# Patient Record
Sex: Male | Born: 1935 | Race: White | Hispanic: No | Smoking: Never smoker
Health system: Southern US, Community
[De-identification: ages and names within clinical notes are randomized; demographics above are authoritative.]

## PROBLEM LIST (undated history)

## (undated) DIAGNOSIS — H269 Unspecified cataract: Secondary | ICD-10-CM

## (undated) DIAGNOSIS — Z7901 Long term (current) use of anticoagulants: Secondary | ICD-10-CM

## (undated) DIAGNOSIS — I4891 Unspecified atrial fibrillation: Secondary | ICD-10-CM

## (undated) DIAGNOSIS — E785 Hyperlipidemia, unspecified: Secondary | ICD-10-CM

## (undated) DIAGNOSIS — R609 Edema, unspecified: Secondary | ICD-10-CM

## (undated) DIAGNOSIS — N400 Enlarged prostate without lower urinary tract symptoms: Secondary | ICD-10-CM

## (undated) DIAGNOSIS — I2510 Atherosclerotic heart disease of native coronary artery without angina pectoris: Secondary | ICD-10-CM

## (undated) HISTORY — PX: CHOLECYSTECTOMY: SHX55

## (undated) HISTORY — PX: HERNIA REPAIR: SHX51

## (undated) HISTORY — DX: Edema, unspecified: R60.9

## (undated) HISTORY — DX: Unspecified atrial fibrillation: I48.91

## (undated) HISTORY — PX: CARDIAC SURGERY: SHX584

## (undated) HISTORY — PX: OTHER SURGICAL HISTORY: SHX169

## (undated) HISTORY — DX: Hyperlipidemia, unspecified: E78.5

## (undated) HISTORY — DX: Long term (current) use of anticoagulants: Z79.01

## (undated) HISTORY — DX: Benign prostatic hyperplasia without lower urinary tract symptoms: N40.0

## (undated) HISTORY — DX: Atherosclerotic heart disease of native coronary artery without angina pectoris: I25.10

## (undated) HISTORY — DX: Unspecified cataract: H26.9

---

## 1996-12-19 HISTORY — PX: CORONARY ARTERY BYPASS GRAFT: SHX141

## 1999-10-11 ENCOUNTER — Inpatient Hospital Stay (HOSPITAL_COMMUNITY): Admission: EM | Admit: 1999-10-11 | Discharge: 1999-10-12 | Payer: Self-pay | Admitting: Emergency Medicine

## 1999-10-11 ENCOUNTER — Encounter: Payer: Self-pay | Admitting: *Deleted

## 1999-12-14 ENCOUNTER — Encounter: Payer: Self-pay | Admitting: General Surgery

## 1999-12-17 ENCOUNTER — Ambulatory Visit (HOSPITAL_COMMUNITY): Admission: RE | Admit: 1999-12-17 | Discharge: 1999-12-18 | Payer: Self-pay | Admitting: General Surgery

## 2000-11-01 ENCOUNTER — Inpatient Hospital Stay (HOSPITAL_COMMUNITY): Admission: AD | Admit: 2000-11-01 | Discharge: 2000-11-04 | Payer: Self-pay | Admitting: *Deleted

## 2004-10-02 ENCOUNTER — Emergency Department (HOSPITAL_COMMUNITY): Admission: EM | Admit: 2004-10-02 | Discharge: 2004-10-02 | Payer: Self-pay | Admitting: *Deleted

## 2004-10-27 ENCOUNTER — Ambulatory Visit: Payer: Self-pay | Admitting: Cardiology

## 2005-01-18 ENCOUNTER — Ambulatory Visit: Payer: Self-pay

## 2005-01-18 ENCOUNTER — Ambulatory Visit: Payer: Self-pay | Admitting: Cardiology

## 2005-09-09 ENCOUNTER — Ambulatory Visit: Payer: Self-pay | Admitting: Cardiology

## 2006-02-20 ENCOUNTER — Ambulatory Visit: Payer: Self-pay | Admitting: Cardiology

## 2006-06-16 ENCOUNTER — Ambulatory Visit: Payer: Self-pay | Admitting: Gastroenterology

## 2006-11-20 ENCOUNTER — Ambulatory Visit: Payer: Self-pay | Admitting: Cardiology

## 2007-08-22 ENCOUNTER — Ambulatory Visit: Payer: Self-pay | Admitting: Cardiology

## 2008-06-06 ENCOUNTER — Ambulatory Visit: Payer: Self-pay | Admitting: Cardiology

## 2009-03-19 DIAGNOSIS — I2510 Atherosclerotic heart disease of native coronary artery without angina pectoris: Secondary | ICD-10-CM

## 2009-03-19 DIAGNOSIS — Z87898 Personal history of other specified conditions: Secondary | ICD-10-CM

## 2009-03-19 DIAGNOSIS — E785 Hyperlipidemia, unspecified: Secondary | ICD-10-CM

## 2009-03-19 DIAGNOSIS — R609 Edema, unspecified: Secondary | ICD-10-CM

## 2009-03-19 DIAGNOSIS — I4891 Unspecified atrial fibrillation: Secondary | ICD-10-CM

## 2009-03-25 ENCOUNTER — Ambulatory Visit: Payer: Self-pay | Admitting: Cardiology

## 2009-09-04 ENCOUNTER — Encounter (INDEPENDENT_AMBULATORY_CARE_PROVIDER_SITE_OTHER): Payer: Self-pay | Admitting: *Deleted

## 2009-11-25 ENCOUNTER — Ambulatory Visit: Payer: Self-pay | Admitting: Cardiology

## 2010-08-17 ENCOUNTER — Encounter: Payer: Self-pay | Admitting: Cardiology

## 2010-12-27 ENCOUNTER — Encounter: Payer: Self-pay | Admitting: Cardiology

## 2011-01-12 ENCOUNTER — Ambulatory Visit
Admission: RE | Admit: 2011-01-12 | Discharge: 2011-01-12 | Payer: Self-pay | Source: Home / Self Care | Attending: Cardiology | Admitting: Cardiology

## 2011-01-12 ENCOUNTER — Encounter: Payer: Self-pay | Admitting: Cardiology

## 2011-01-20 NOTE — Assessment & Plan Note (Signed)
Summary: [facility]   Visit Type:  Follow-up Primary Provider:  Dr. Vernon Prey  CC:  Atrial fibrillation and CAD.  History of Present Illness: The patient returns for follow up of atrial fibrillation and CAD.  Since I last saw him he has done quite well.  He does his activities of daily living.  He does not have any chest discomfort, neck or arm discomfort.  He does not notice his palpitations and he has no presyncope or syncope.  He has had no new SOB, PND or orthopnea.  He tolerates coumandin.  Current Medications (verified): 1)  Simvastatin 20 Mg Tabs (Simvastatin) .... Take 1/2 Tablet By Mouth Daily At Bedtime 2)  Metoprolol Tartrate 50 Mg Tabs (Metoprolol Tartrate) .... Take One Tablet By Mouth Twice A Day 3)  Lisinopril 10 Mg Tabs (Lisinopril) .... Take One Tablet By Mouth At Bedtime 4)  Isosorbide Mononitrate Cr 30 Mg Xr24h-Tab (Isosorbide Mononitrate) .... Take One Tablet By Mouth Daily 5)  Warfarin Sodium 5 Mg Tabs (Warfarin Sodium) .... Use As Directed By Anticoagulation Clinic 6)  Potassium Chloride Crys Cr 20 Meq Cr-Tabs (Potassium Chloride Crys Cr) .... Take One Tablet By Mouth Daily 7)  Fish Oil   Oil (Fish Oil) .... 3 Once Daily 8)  Calcium Carbonate-Vitamin D 600-400 Mg-Unit  Tabs (Calcium Carbonate-Vitamin D) .... Once Daily 9)  Omeprazole 20 Mg Cpdr (Omeprazole) .... One By Mouth At Bedtime 10)  Docusate Sodium 100 Mg Caps (Docusate Sodium) .... As Needed  Allergies (verified): 1)  ! Penicillin  Past History:  Past Medical History: Reviewed history from 03/19/2009 and no changes required. EDEMA (ICD-782.3) BENIGN PROSTATIC HYPERTROPHY, HX OF (ICD-V13.8) COUMADIN THERAPY (ICD-V58.61) ATRIAL FIBRILLATION (ICD-427.31) HYPERLIPIDEMIA-MIXED (ICD-272.4) CAD, UNSPECIFIED SITE (ICD-414.00) (status post CABG in 1998, had a LIMA to       the LAD, saphenous vein graft to diagonal, saphenous vein graft to       an obtuse marginal 1, and saphenous vein graft to an  obtuse       marginal 2, and obtuse marginal to the PDA.  He did have       catheterization in 2001 demonstrating grafts to be patent).     Past Surgical History: CABG -- 1998 Cholecystectomy Bilateral inguinal hernia repairs  Review of Systems       As stated in the HPI and negative for all other systems.   Vital Signs:  Patient profile:   77 year old male Height:      65 inches Weight:      173 pounds BMI:     28.89 Pulse rate:   85 / minute Resp:     16 per minute BP sitting:   142 / 78  (right arm)  Vitals Entered By: Marrion Coy, CNA (January 12, 2011 11:26 AM)  Physical Exam  General:  Well developed, well nourished, in no acute distress. Head:  normocephalic and atraumatic Neck:  Neck supple, no JVD. No masses, thyromegaly or abnormal cervical nodes. Chest Wall:  well-healed sternal scar Lungs:  Clear bilaterally to auscultation and percussion. Heart:  S1 and S2 within normal limits, no S3, no S4, no clicks, no rubs, no murmurs. Abdomen:  Bowel sounds positive; abdomen soft and non-tender without masses, organomegaly, or hernias noted. No hepatosplenomegaly. Msk:  Back normal, normal gait. Muscle strength and tone normal. Extremities:  Rght greater than left lower extremity edema to mid calf with saphenous vein graft harvest scar on the right Neurologic:  Alert and oriented  x 3. Skin:  Intact without lesions or rashes. Cervical Nodes:  no significant adenopathy Psych:  Normal affect.    EKG  Procedure date:  01/12/2011  Findings:      atrial fibrillation right axis deviation, nonspecific T wave inversions, incomplete right bundle branch block  Impression & Recommendations:  Problem # 1:  ATRIAL FIBRILLATION (ICD-427.31) He is having no symptoms related to this. No change in therapy is indicated. Orders: EKG w/ Interpretation (93000)  Problem # 2:  CAD, UNSPECIFIED SITE (ICD-414.00) He is having no symptoms and is participating in risk reduction.  No  change in therapy is indicated.   Orders: EKG w/ Interpretation (93000)  Problem # 3:  HYPERLIPIDEMIA-MIXED (ICD-272.4) He has an excellent lipid proflie.  He will continue with the meds as listed.  Patient Instructions: 1)  Your physician has recommended you make the following change in your medication:  2)  Your physician wants you to follow-up in:  1 yr in [place] with Dr Antoine Poche. You will receive a reminder letter in the mail two months in advance. If you don't receive a letter, please call our office to schedule the follow-up appointment.

## 2011-05-03 NOTE — Assessment & Plan Note (Signed)
[facility]                            CARDIOLOGY OFFICE NOTE   QUADIR, MUNS                    MRN:          [id number]  DATE:08/22/2007                            DOB:          25-Jun-1915    [name] is a delightful 77 year old gentleman who continues to do  well.  He says that his legs have some weakness at times.  He is not  having pain.  He has not had syncope or presyncope.  There is no chest  pain.  There is no major shortness of breath.  He does have coronary  disease that has been stable.  His lipids are followed carefully through  Dr. Kathi Der office.   PAST MEDICAL HISTORY:  See the list below.   ALLERGIES:  PENICILLIN.   MEDICATIONS:  Omeprazole, metoprolol, lisinopril, Imdur, furosemide,  Coumadin, potassium, fish oil, calcium, and simvastatin.   REVIEW OF SYSTEMS:  As mentioned, he has had some mild gait problems,  otherwise his review of systems is negative, and as he says, he has  nothing to complain about.   PHYSICAL EXAMINATION:  Blood pressure is 140/71 with a pulse of 87, and  his weight is 174 pounds.  Patient is oriented to person, time, and place.  Affect is normal.  HEENT:  No xanthelasma.  He has normal extraocular motion.  NECK:  There are no carotid bruits.  There is no jugular venous  distention.  LUNGS:  Clear.  Respiratory effort is not labored.  CARDIAC:  An S1 with an S2.  ABDOMEN:  His are intact.  There are no masses or breath sounds.  He has  normal bowel sounds.  EXTREMITIES:  He has trace peripheral edema at his right ankle.  He has  1-2+ distal pulses.  There are no musculoskeletal deformities.   EKG shows no significant change.  He has chronic atrial fibrillation  with a controlled rate.   PROBLEMS:  1. Atrial fibrillation with a controlled rate on Coumadin.  2. Coumadin therapy.  3. Coronary disease, stable.  4. History of gynecomastia in the past related to digoxin.  5. Status post  cholecystectomy.  6. Status post bilateral inguinal hernia repairs and a laparoscopic      cholecystectomy and hiatal hernia repair.  7. Benign prostatic hypertrophy.  8. Cholesterol, treated.  9. Slight swelling in his right ankle.  We know from the past that he      has no deep venous thrombosis.  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   [name] [name] is doing well.  I can see him back in 9 months.     [name] Abed, MD, [facility]  Electronically Signed    JDK/MedQ  DD: 08/22/2007  DT: 08/22/2007  Job #: [id number]   cc:   Ernestina Penna, M.D.

## 2011-05-03 NOTE — Assessment & Plan Note (Signed)
[facility]                            CARDIOLOGY OFFICE NOTE   [name] [name], [name] [name]                    MRN:          [id number]  DATE:03/25/2009                            DOB:          [birth year]    PRIMARY CARE PHYSICIAN:  Ernestina Penna, MD   REASON FOR PRESENTATION:  Evaluate the patient with coronary artery  disease and atrial fibrillation.   HISTORY OF PRESENT ILLNESS:  The patient is a 77 year old gentleman.  He  has routinely seen Dr. Myrtis Ser.  I did see him several years ago in Dr.  Henrietta Hoover place.  He gets followup every 8 months because of his  cardiovascular problems.  These are described below.  In short, he has  had an infarct with thrombolytics in 1984.  Had bypass graft in 1998.  He has been managed medically since 2001 when he had his last cath.  Apparently gets along fairly well from this standpoint.  He does not  describe any ongoing chest pressure, neck, or arm discomfort.  He stays  active.  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  He lives by  himself.  He does have atrial fibrillation, but does not feel his heart  beating.  He has no palpitations, presyncope, or syncope.  He has had a  couple of falls.  Yesterday actually while getting off his lawn mower he  caught his foot and fell, but he did not hurt himself.  Prior to seeing  Dr. Myrtis Ser last year, he had loss of his footing.  However, these are the  only 2 episodes.  He has not had any frank loss of consciousness.  He is  careful and walks with a cane typically.  He has slowed down a little  bit because of his mild sense of feeling off balance.   PAST MEDICAL HISTORY:  1. Coronary artery disease (status post CABG in 1998, had a LIMA to      the LAD, saphenous vein graft to diagonal, saphenous vein graft to      an obtuse marginal 1, and saphenous vein graft to an obtuse      marginal 2, and obtuse marginal to the PDA.  He did have      catheterization in 2001  demonstrating grafts to be patent).  2. Persistent atrial fibrillation.  3. Benign prostatic hypertrophy.  4. L4-L5 disc disease.  5. Sigmoid diverticulosis.  6. Hiatal hernia repair.  7. Cholecystectomy.  8. Bilateral inguinal hernia repairs.  9. Transurethral resection of the prostate.  10.CABG as above.   ALLERGIES:  PENICILLIN.   CURRENT MEDICATIONS:  1. Omeprazole 20 mg daily.  2. Metoprolol 50 mg daily.  3. Lisinopril 10 mg daily.  4. Isosorbide 30 mg daily.  5. Furosemide 20 mg daily.  6. Coumadin.  7. Potassium 20 mEq daily.  8. Fish oil.  9. Calcium.  10.Simvastatin 10 mg daily.   REVIEW OF SYSTEMS:  As stated in the HPI and otherwise negative for all  other systems.   PHYSICAL EXAMINATION:  GENERAL:  The patient is pleasant in no distress.  VITAL SIGNS:  Blood pressure 140/70, heart rate 79 and irregular, weight  177 pounds, and body mass index 29.  HEENT:  Eyelids unremarkable, pupils equal, round, and reactive to  light, fundi not visualized, oral mucosa unremarkable.  NECK:  No jugular venous distention at 45 degrees, carotid upstroke  brisk and symmetric, no bruits, no thyromegaly.  LYMPHATICS:  No cervical, axillary, or inguinal adenopathy.  LUNGS:  Clear to auscultation bilaterally.  BACK:  No costovertebral angle tenderness.  CHEST:  Well-healed sternotomy scar.  HEART:  PMI not displaced or sustained, S1 and S2 within normal limits;  no S3, no murmurs.  ABDOMEN:  Flat; positive bowel sounds, normal in frequency and pitch, no  bruits, no rebound, no guarding, no midline pulsatile mass, no  hepatomegaly, no splenomegaly.  SKIN:  No rashes.  No nodules.  EXTREMITIES:  Pulses 2+ throughout, trace bilateral lower extremity  edema.  NEUROLOGIC:  Oriented to person, place, and time, cranial nerves through  XII grossly intact, motor grossly intact.   EKG, atrial fibrillation, rate 79, right axis deviation, no acute ST-T  wave changes.    ASSESSMENT/PLAN:  1. Coronary artery disease.  I reviewed all of the old records.  Given      his absence of symptoms, he is going to continue to be managed      medically.  With his age, we will continue with risk reduction      alone.  No further cardiovascular testing is suggested.  2. Atrial fibrillation.  I have had a long discussion with him about      whether he is at risk for falls.  He understands the risk of      falling while on the Coumadin and the need for being very careful      to avoid this.  However, I do not think there was a      contraindication to the Coumadin, but would wait for further      events.  If he has more of these falls in any kind of a pattern,      then he would have to come off the Coumadin.  Again we had a long      discussion about this.  3. Hypertension.  Blood pressure is at the upper limits of acceptable.      He will continue the medicines as listed.  4. Follow up.  I will see the patient again in 8 months per his      request.  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     Rollene Rotunda, MD, [facility]  Electronically Signed    JH/MedQ  DD: 03/25/2009  DT: 03/26/2009  Job #: [id number]   cc:   Ernestina Penna, M.D.

## 2011-05-03 NOTE — Assessment & Plan Note (Signed)
[name]                            CARDIOLOGY OFFICE NOTE   [name], [name] [name]                    MRN:          [id number]  DATE:06/06/2008                            DOB:          06-Jan-1915    [name] [name] is seen for followup.  He is 77 years old.  He looks great.  He has not had syncope or presyncope.  There has been no chest pain or  shortness of breath.  He did fall once after being startled by someone  knocking at his back door.  This was not a syncopal episode.  It is not  an indication to stop his Coumadin at this point.   He does have atrial fib and has been stable.   PAST MEDICAL HISTORY:   ALLERGIES:  PENICILLIN.   MEDICATIONS:  Omeprazole, metoprolol, lisinopril, Imdur, furosemide,  Coumadin, potassium, fish oil, calcium, and simvastatin.   OTHER MEDICAL PROBLEMS:  See the list below.   REVIEW OF SYSTEMS:  He really feels well.  His review of systems is  negative.   PHYSICAL EXAMINATION:  VITAL SIGNS:  Weight is 173 pounds.  Blood  pressure is 130/70 with a pulse of 67.  GENERAL:  The patient is oriented to person, time, and place.  Affect is  normal.  HEENT:  No xanthelasma.  He has normal extraocular motion.  NECK:  There are no carotid bruits.  There is no jugular venous  distention.  LUNGS:  Clear.  Respiratory effort is not labored.  CARDIAC:  S1 with an S2.  There are no clicks or significant murmurs.  Rhythm is irregularly irregular.  ABDOMEN:  Soft.  EXTREMITIES: There is no significant peripheral edema.   EKG reveals atrial fibrillation with a controlled ventricular response.  He has an old decreased anterior R-wave progression.   PROBLEMS:  1. Atrial fibrillation.  Rate controlled.  He is on Coumadin.  No      change in his therapy.  2. Coumadin therapy.  3. Coronary disease, stable.  He does not need any testing for his      coronary disease at this time.  The patient is post coronary artery      bypass  graft in the past.  Last catheterization was done in 2001.      He had a total occlusion of his native right.  He had inferior      hypokinesis.  Ejection fraction then was 58% with mild mitral      regurgitation.  4. History of gynecomastia in the past related to digoxin.  5. Status post cholecystectomy.  6. Status post bilateral inguinal hernia repair, laparoscopic      cholecystectomy, and hiatal hernia repair in the past.  7. Benign prostatic hypertrophy.  8. Elevated cholesterol, treated.  9. Mild swelling in one of his ankles historically, and this is an old      problem.   [name] [name] is stable.  It may be easier for him to be seen in the [name].  Travel is becoming difficult for him.  He has not yet decided.  I made it clear that we would be happy to arrange whatever is best for  him.     [name], MD, [facility]  Electronically Signed    JDK/MedQ  DD: 06/06/2008  DT: 06/06/2008  Job #: [id number]   cc:   Ernestina Penna, M.D.

## 2011-05-06 NOTE — Cardiovascular Report (Signed)
[place]  Patient:    [name] [name], [name] [name]                      MRN: [id number] Proc. Date: 11/01/00 Adm. Date:  [id number] Attending:  Daisey Must CC:         Monica Becton, M.D.  [name], M.D. [facility]  Cardiac Cath Lab   Cardiac Catheterization  PROCEDURES PERFORMED:  Left heart catheterization with coronary angiography, bypass graft angiography, and left ventriculography.  INDICATIONS:  Mr. [name] is an 77 year old male with history of previous coronary artery bypass grafting.  He presented to [facility] this morning with left arm pain and inferior ST segment elevation. He was transferred emergently to cardiac catheterization laboratory.  DESCRIPTION OF PROCEDURE:  A 7 French sheath was placed in the right femoral artery and 6 French sheath in the right femoral vein.  Catheters utilized included a 6 [place] JL4, JR4, LCV, IM, and angled pigtail.  Contrast was hexabrix.  There were no complications.  RESULTS:  HEMODYNAMICS:  Left ventricular pressure 156/15.  Aortic pressure 140/65. There was no aortic valve gradient.  LEFT VENTRICULOGRAM:  There is moderate hypokinesis of the inferior wall. Ejection fraction calculated at 58%.  2+ mild mitral regurgitation.  CORONARY ARTERIOGRAPHY:  (Right dominant).  Left main:  Left main has diffuse 50% stenosis.  Left anterior descending:  The LAD has a 95% stenosis in the proximal and mid vessel.  There is a normal sized first diagonal branch which has a 95% stenosis.  Both the first diagonal and the distal LAD filled via bypass grafts.  Left circumflex:  The left circumflex is 100% occluded after a small first obtuse marginal branch.  Proximal to its occlusion, it gives rise to a small ramus intermedius and a small OM-1.  The distal circumflex consists of a normal sized second marginal and large third marginal and a small fourth marginal.  These fill via  saphenous vein grafts as to be described below.  Right coronary artery:  The right coronary artery is 100% occluded proximally with thrombus and significant calcification.  It has a high anterior takeoff. The distal right coronary artery consisting of a normal sized posterior descending artery, a small posterolateral branch filled via saphenous vein graft.  Left internal mammary artery:  Left internal mammary artery to the distal LAD is patent throughout its course and fills the mid and distal LAD.  Saphenous vein graft to the first diagonal has a 20% stenosis proximally but otherwise patent and fills a normal sized first diagonal.  Saphenous vein graft to the second obtuse marginal branch is patent with diffuse 20% stenosis throughout the graft but good flow distally.  Saphenous vein graft to the third obtuse marginal branch is patent with moderate scattered areas of ectasia.  This fills the third marginal branch which is large and retrograde to the circumflex and fourth marginal branch. In the proximal portion of the third marginal branch is an 80% stenosis.  Saphenous vein graft to the posterior descending artery is patent filling a normal sized posterior descending artery.  There is an 80% stenosis in the proximal PDA before the vein graft insertion.  This graft also fills retrograde to a small posterolateral branch.  In comparison to the previous catheterization, the native right coronary artery had previously been patent to the mid vessel where it ended as an acute marginal and then was was occluded.  This vessel is now  occluded.  I have noted on previous catheterization the right coronary artery is diffusely diseased, and the proximal and mid vessel with diffuse 95% stenosis and heavy calcifications.  IMPRESSIONS: 1. Native three-vessel coronary artery disease. 2. Patent bypass grafts x 5. 3. Left ventricular systolic function at lower range of normal with mild    mitral  regurgitation. 4. Acute myocardial infarction secondary to occlusion of the native right    coronary artery which previously supplied an acute marginal branch.  PLAN:  Because of the small territory of myocardium served by the right coronary artery which is now occluded and previously seen to be diffusely diseased vessel with heavy calcification, the best approach here appears to be medical therapy with no attempts at intervention.  The patient has preserved left ventricular function and all of his major coronary arteries supplying the left ventricular are perfused by bypass grafts.  These findings were reviewed with Dr. Corinda Gubler who concurs with the treatment plan.  The patient will be treated with standard post MI care including heparin, nitroglycerin, and beta-blocker. DD:  11/01/00 TD:  11/01/00 Job: [id number]

## 2011-05-06 NOTE — Discharge Summary (Signed)
[place]. [facility]  Patient:    [name] [name], [name] [name]                      MRN: [id number] Adm. Date:  [id number] Disc. Date: [id number] Attending:  Daisey Must Dictator:   Tereso Newcomer, P.A. CC:         Monica Becton, M.D.   Referring Physician Discharge [facility]  DATE OF BIRTH:  07-07-1915  DISCHARGE DIAGNOSES:  1. Status post acute inferior wall myocardial infarction.  2. Coronary artery disease.  3. Status post coronary artery bypass grafting in July 1998, left internal     mammary artery to left anterior descending, saphenous vein graft to D1,     saphenous vein graft to obtuse marginal 1, saphenous vein graft to obtuse     marginal 2, saphenous vein graft to posterior descending artery.  4. Postoperative atrial fibrillation.  5. Inferior myocardial infarction in 1984 treated with ______ kinase and     percutaneous transluminal coronary angiography of the right coronary     artery.  6. History of paroxysmal atrial fibrillation.  7. Benign prostatic hypertrophy.  8. Hiatal hernia repair.  9. Status post laparoscopic cholecystectomy December 2000. 10. Status post bilateral inguinal hernia repair.  PROCEDURE:  Cardiac catheterization by Dr. Daisey Must on November 01, 2000 revealing left main diffuse 50% stenosis, LAD 95% stenosis in proximal and mid vessel, normal sized first diagonal branch with 95% stenosis, first diagonal and distal LAD field to field bypass grafts, left circumflex 100% occluded after small first obtuse marginal branch, RCA 100% occluded proximally with thrombus and significant calcification, LIMA to LAD patent through its course and fills the mid and distal LAD, saphenous vein graft to first diagonal with 20% stenosis proximally, but otherwise patent, fills normal size first diagonal, SVG to second obtuse marginal patent with diffuse 20% stenosis throughout graft, but good blood flow distally, SVG to third obtuse  marginal branch patent with moderate scattered areas of ectasia, proximal portion of third marginal branch with 80% stenosis, SVG to PDA patent filling of normal sized posterior descending artery, 80% stenosis of the proximal PDA before the vein graft insertion.  It appeared with comparison to previous catheterizations that the occluded RCA was the culprit lesion to the patients MI.  ADMISSION HISTORY:  This 77 year old male was transferred from [facility] with acute inferior MI.  On the date prior to admission at 7 p.m. he developed left arm throbbing that was described as a 6/10.  He denied any radiation, shortness of breath, nausea, vomiting, or diaphoresis.  It lasted about four hours and subsided.  After waking on the morning of admission he developed similar symptoms with associated nausea.  This resembled his pain from 1984.  He went to his doctor.  EKG revealed sinus bradycardia with 1-2 mm ST segment elevation in inferior lead, 2-3 mm ST segment elevation in V4 and V5.  He received two sublingual nitroglycerin with some relief.  PHYSICAL EXAMINATION:  VITAL SIGNS:  Blood pressure 148/68.  NECK:  Without JVD.  LUNGS:  Clear to auscultation.  HEART:  Regular rate and rhythm without murmurs.  ABDOMEN:  Soft, nontender.  HOSPITAL COURSE:  He was taken to the catheterization lab emergently.  The results of the catheterization are noted above.  Please refer to Dr. Wanita Chamberlain full dictated note for complete details.  The patient had no immediate complications and he tolerated the procedure well.  Given the  results of the catheterization, it was felt the patient should undergo medical therapy.  Imdur was started.  His Cardizem was discontinued.  He was also started on Lipitor for plaque stabilization.  Altace was also initiated.  The patient continued to do well without further complaints.  He was continued on heparin until the morning of November 04, 2000.  On  the morning of November 04, 2000 the patient was doing well and it was felt he was stable enough for discharge to home.  LABORATORIES:  White count 8300, hemoglobin 12.3, hematocrit 36.2, platelet count 217,000.  INR 1.0.  Sodium 134, potassium 4.4, chloride 99, CO2 29, glucose 124, BUN 11, creatinine 0.7, calcium 8.5, total protein 5.4, albumin 2.8, AST 49, ALT 16, alkaline phosphatase 59, total bilirubin 0.3.  Total CK 73, #2 279, #3 525, #401.  CK-MB 3.7, #2 41.9, #3 69.1, #4 38.1.  Troponin I 0.12, #2 1.59.  Last set of cardiac enzymes:  Total CK 296, CK-MB 22.0.  Lipid profile:  Total cholesterol 147, triglycerides 78, HDL 30, LDL 101.  DISCHARGE MEDICATIONS: 1. Coated aspirin 325 mg q.d. 2. Nitroglycerin 0.4 mg sublingual p.r.n. chest pain. 3. Prevacid 30 mg q.d. 4. Lopressor 50 mg one-half tablet b.i.d. 5. Imdur 30 mg q.d. 6. Altace 2.5 mg q.d. 7. Lasix 20 mg q.d. 8. K-Dur 20 mEq q.d.  ACTIVITY:  No lifting, driving, sexual activity, or heavy exertion for three days.  DIET:  Low fat, low salt, low cholesterol.  WOUND CARE:  The patient should observe his catheterization site for bruising, drainage, swelling, or discomfort and call if concerns.  He has been instructed to stop taking his Cardizem.  FOLLOW-UP:  With Dr. Myrtis Ser on Monday, December 3 at 11 a.m.DD:  11/13/00 TD:  11/13/00 Job: [id number]

## 2011-05-06 NOTE — Discharge Summary (Signed)
[place]. [facility]  Patient:    [name] [name], [name] [name]                      MRN: [id number] Adm. Date:  [id number] Disc. Date: [id number] Attending:  Daisey Must Dictator:   Tereso Newcomer, P.A.                  Referring Physician Discharge [facility]  DATE OF BIRTH:  February 05, 1915. DD:  11/13/00 TD:  11/13/00 Job: [id number]

## 2011-05-06 NOTE — Consult Note (Signed)
NAME:  [name] [name], [name] [name] NO.:  [id number]   MEDICAL RECORD NO.:  [id number]          PATIENT TYPE:  EMS   LOCATION:  [facility]                         FACILITY:  [facility]   PHYSICIAN:  Salvadore Farber, M.D. LHCDATE OF BIRTH:  November 22, 1915   DATE OF CONSULTATION:  10/02/2004  DATE OF DISCHARGE:                                   CONSULTATION   CHIEF COMPLAINT:  Left chest discomfort.   HISTORY OF PRESENT ILLNESS:  [name] [name] is a very vigorous 77 year old  gentleman with coronary disease, status post inferior myocardial infarction  and coronary artery bypass grafting in 1998.  He is extremely active,  walking two miles per day.  He is widowed and lives alone.  He was started  earlier this week on Coumadin for longstanding chronic atrial fibrillation.   Beginning approximately 10 p.m. last night, [name] [name] has had several  episodes of fleeting episodes of moderate lateral left chest discomfort,  each lasting one second.  No radiation, no pleuritic nature, no associated  nausea, vomiting, diaphoresis or dyspnea.  No positional nature to the pain.  Other than this, he feels completely normal.  He tells me that if he had not  been alone and been able to talk about this with anyone, he would not have  come to the emergency room.  He feels entirely comfortable at present.   PAST MEDICAL HISTORY:  1.  Coronary artery disease, status post coronary artery bypass grafting in      1998.  2.  Distant inferior myocardial infarction.  3.  Atrial fibrillation.  4.  Dyslipidemia.  5.  Status post bilateral inguinal hernia repairs.  6.  Status post cholecystectomy.   ALLERGIES:  PENICILLIN.   CURRENT MEDICATIONS:  1.  K-Dur 20 mEq q.d.  2.  Toprol XL 25 mg b.i.d.  3.  Coumadin 5 mg q.h.s.  4.  Lasix 20 mg q.d.  5.  Imdur 30 mg q.d.  6.  Prevacid 30 mg q.d.  7.  Altace 2.5 mg q.d.  8.  Lipitor 5 mg q.d.   SOCIAL HISTORY:  [name] [name] has been widowed for the past seven years and  lives alone in [place].  He has a friend with him he speaks to daily Mariel Sleet [phone]).  She has his power of attorney.  He has no children.  He  walks two miles per day.  Denies alcohol and tobacco use ever.   FAMILY HISTORY:  Father died at 56 of myocardial infarction.  Mother died at  50 of old age.  All siblings have died, except for a single sister.  Siblings died of complications of diabetes, cancer and alcohol abuse.   REVIEW OF SYSTEMS:  Negative in detail, with the sole exception of mild  lower extremity edema on the right after his coronary bypass graft surgery.   PHYSICAL EXAMINATION:  GENERAL:  He is generally well appearing, in no  distress.  VITAL SIGNS:  Heart rate 80, blood pressure 126/86, oxygen saturation 97% on  room air and temperature 97.2.  NECK:  He has no jugular venous  distention and no thyromegaly.  There is no  supraclavicular or cervical lymphadenopathy.  LUNGS:  Clear to auscultation.  CARDIAC:  He has some nondisplaced point of maximal cardiac impulse.  There  is an irregularly irregular rhythm; without murmurs, rubs or S3.  ABDOMEN:  Soft, nondistended and nontender.  There is no hepatosplenomegaly.  Bowel sounds are normal.  EXTREMITIES:  Warm without clubbing, cyanosis, edema or ulceration.  Carotid  pulses are 2+ bilaterally without bruits.  Femoral pulses 2+ bilaterally  without bruits.   ELECTROCARDIOGRAM:  Demonstrates atrial fibrillation with minor nonspecific  ST-T abnormalities.   LABORATORY STUDIES:  Remarkable for INR 4.2, Point-of-care Troponin's less  than 0.05 x3.  BNP 157.   IMPRESSION/RECOMMENDATIONS:  An 77 year old gentleman with known coronary  disease, who presents with very fleeting chest pain which is extremely  atypical for myocardial ischemia.  Electrocardiogram was without evidence of  ischemia.  Point-of-care Troponin's are negative x3.  His symptoms are most  consistent with gas pains.  Extremely low suspicion for  myocardial ischemia.   We will check a single additional point-of-care marker.  If negative, will  discharge home to follow up with Dr. Myrtis Ser and Dr. Christell Constant.   His INR is elevated after starting Coumadin just this week.  Will have him  hold Coumadin today and resume at 5 mg q.h.s.       WED/MEDQ  D:  10/02/2004  T:  10/02/2004  Job:  [id number]   cc:   Ernestina Penna, M.D.  [address] [place]  [place] [address]  Fax: [phone]   Willa Rough, M.D.

## 2011-05-06 NOTE — Assessment & Plan Note (Signed)
[facility]                            CARDIOLOGY OFFICE NOTE   TALTON, DELPRIORE                    MRN:          [id number]  DATE:11/20/2006                            DOB:          24-Dec-1914    [name] is seen for followup.  Mr. Winders is doing well.  He is not  having any significant problems.  He feels well today.  I have seen him  on a limited basis today and he is doing very well.  He is not having  any chest pain or shortness of breath.   PHYSICAL EXAM:  Blood pressure is 118/68 and his pulse is 82.  The patient is oriented to person, time, and place and his affect is  normal.  LUNGS:  Clear.  Respiratory effort is not labored.  CARDIAC:  An S1 with an S2.  There are no clicks or significant murmurs.   The patient's labs are sent from Dr. Kathi Der office, and his cholesterol  is being followed carefully.   PROBLEMS:  1. Atrial fibrillation on Coumadin.  2. Coumadin therapy.  3. Coronary artery disease, stable.  4. History of gynecomastia in the past, improved when digoxin was      stopped.  5. Status post cholecystectomy.  6. Status post bilateral inguinal hernia repairs and laparoscopic      cholecystectomy, and hiatal hernia repair.  7. Benign prostatic hypertrophy.  8. Cholesterol being treated.  9. Swelling in his legs.  He has no deep vein thrombosis.  He had      dilatation of the left popliteal vein, which can be kept in mind,      but no treatment is needed.   Cardiac status is stable.  I will see him back in 9 months.     [name], MD, [facility]  Electronically Signed    JDK/MedQ  DD: 11/20/2006  DT: 11/20/2006  Job #: [id number]   cc:   Ernestina Penna, M.D.

## 2011-07-22 ENCOUNTER — Encounter: Payer: Self-pay | Admitting: Cardiology

## 2011-12-27 ENCOUNTER — Encounter: Payer: Self-pay | Admitting: Cardiology

## 2012-01-11 ENCOUNTER — Ambulatory Visit (INDEPENDENT_AMBULATORY_CARE_PROVIDER_SITE_OTHER): Payer: Medicare Other | Admitting: Cardiology

## 2012-01-11 ENCOUNTER — Encounter: Payer: Self-pay | Admitting: Cardiology

## 2012-01-11 DIAGNOSIS — I4891 Unspecified atrial fibrillation: Secondary | ICD-10-CM

## 2012-01-11 DIAGNOSIS — E785 Hyperlipidemia, unspecified: Secondary | ICD-10-CM

## 2012-01-11 DIAGNOSIS — I2510 Atherosclerotic heart disease of native coronary artery without angina pectoris: Secondary | ICD-10-CM

## 2012-01-11 NOTE — Assessment & Plan Note (Signed)
The patient has no new sypmtoms.  No further cardiovascular testing is indicated.  We will continue with aggressive risk reduction and meds as listed.  

## 2012-01-11 NOTE — Assessment & Plan Note (Signed)
The patient  tolerates this rhythm and rate control and anticoagulation. We will continue with the meds as listed.  

## 2012-01-11 NOTE — Patient Instructions (Addendum)
Continue current medications as listed.  Follow up in 1 year with Dr Hochrein.  You will receive a letter in the mail 2 months before you are due.  Please call us when you receive this letter to schedule your follow up appointment.  

## 2012-01-11 NOTE — Progress Notes (Signed)
HPI The patient presents for one year follow up.  Since I last saw him he has done well.  The patient denies any new symptoms such as chest discomfort, neck or arm discomfort. There has been no new shortness of breath, PND or orthopnea. There have been no reported palpitations, presyncope or syncope. Is by himself. He stays himself a cane but does extremely well otherwise.  Allergies  Allergen Reactions  . Penicillins     Current Outpatient Prescriptions  Medication Sig Dispense Refill  . Calcium Carbonate-Vitamin D 600-400 MG-UNIT per tablet Take 1 tablet by mouth daily.        Marland Kitchen docusate sodium (COLACE) 100 MG capsule Take 100 mg by mouth as needed.        . furosemide (LASIX) 20 MG tablet Take 20 mg by mouth daily.      . isosorbide mononitrate (IMDUR) 30 MG 24 hr tablet Take 30 mg by mouth daily.        Marland Kitchen lisinopril (PRINIVIL,ZESTRIL) 10 MG tablet Take 10 mg by mouth at bedtime.        . metoprolol (LOPRESSOR) 50 MG tablet Take 50 mg by mouth 2 (two) times daily.        . Omega-3 Fatty Acids (FISH OIL) 1000 MG CAPS Take 3 capsules by mouth daily. 1000 mg?       . omeprazole (PRILOSEC) 20 MG capsule Take 20 mg by mouth at bedtime.        . potassium chloride SA (K-DUR,KLOR-CON) 20 MEQ tablet Take 20 mEq by mouth daily.        . simvastatin (ZOCOR) 20 MG tablet Take 20 mg by mouth at bedtime.       Marland Kitchen warfarin (COUMADIN) 5 MG tablet Take 5 mg by mouth daily.          Past Medical History  Diagnosis Date  . Edema   . BPH (benign prostatic hypertrophy)     hx  . Encounter for long-term (current) use of anticoagulants     coumadin theraoy  . Atrial fibrillation   . HLD (hyperlipidemia)     miixed  . CAD (coronary artery disease)     Past Surgical History  Procedure Date  . Coronary artery bypass graft 1998    LIMA to LAD, SVG to diagonal, SVG to OM1, SVG to OM2, OM to PDA. had a cath in 2001 demonstratig grafts to be patent  . Cholecystectomy   . Bilateral inguinal hernia  repairs     ROS:  As stated in the HPI and negative for all other systems.  PHYSICAL EXAM BP 118/72  Pulse 66  Resp 16  Ht 5\' 6"  (1.676 m)  Wt 175 lb (79.379 kg)  BMI 28.25 kg/m2 GENERAL:  Well appearing HEENT:  Pupils equal round and reactive, fundi not visualized, oral mucosa unremarkable NECK:  No jugular venous distention, waveform within normal limits, carotid upstroke brisk and symmetric, no bruits, no thyromegaly LYMPHATICS:  No cervical, inguinal adenopathy LUNGS:  Clear to auscultation bilaterally BACK:  No CVA tenderness CHEST:  Well healed sternotomy scar. HEART:  PMI not displaced or sustained,S1 and S2 within normal limits, no S3, no S4, no clicks, no rubs, no murmurs, irregular ABD:  Flat, positive bowel sounds normal in frequency in pitch, no bruits, no rebound, no guarding, no midline pulsatile mass, no hepatomegaly, no splenomegaly EXT:  2 plus pulses throughout, mild right leg edema, no cyanosis no clubbing SKIN:  No rashes no nodules NEURO:  Cranial nerves II through XII grossly intact, motor grossly intact throughout PSYCH:  Cognitively intact, oriented to person place and time  EKG:  Atrial fibrillation rate 71, no acute ST T wave changes. 01/11/2012  ASSESSMENT AND PLAN

## 2012-01-11 NOTE — Assessment & Plan Note (Signed)
This was excellent and is followed by Dr. Christell Constant.

## 2013-03-13 ENCOUNTER — Ambulatory Visit (INDEPENDENT_AMBULATORY_CARE_PROVIDER_SITE_OTHER): Payer: Medicare Other | Admitting: Family Medicine

## 2013-03-13 ENCOUNTER — Encounter: Payer: Self-pay | Admitting: Family Medicine

## 2013-03-13 VITALS — BP 139/86 | HR 79 | Temp 96.2°F | Ht 65.75 in | Wt 178.0 lb

## 2013-03-13 DIAGNOSIS — L2089 Other atopic dermatitis: Secondary | ICD-10-CM

## 2013-03-13 DIAGNOSIS — D649 Anemia, unspecified: Secondary | ICD-10-CM

## 2013-03-13 DIAGNOSIS — L209 Atopic dermatitis, unspecified: Secondary | ICD-10-CM

## 2013-03-13 DIAGNOSIS — E559 Vitamin D deficiency, unspecified: Secondary | ICD-10-CM

## 2013-03-13 DIAGNOSIS — R350 Frequency of micturition: Secondary | ICD-10-CM

## 2013-03-13 DIAGNOSIS — E785 Hyperlipidemia, unspecified: Secondary | ICD-10-CM

## 2013-03-13 DIAGNOSIS — I4891 Unspecified atrial fibrillation: Secondary | ICD-10-CM

## 2013-03-13 DIAGNOSIS — R413 Other amnesia: Secondary | ICD-10-CM

## 2013-03-13 DIAGNOSIS — N4829 Other inflammatory disorders of penis: Secondary | ICD-10-CM

## 2013-03-13 DIAGNOSIS — I2510 Atherosclerotic heart disease of native coronary artery without angina pectoris: Secondary | ICD-10-CM

## 2013-03-13 LAB — BASIC METABOLIC PANEL WITH GFR
BUN: 22 mg/dL (ref 6–23)
Calcium: 9.1 mg/dL (ref 8.4–10.5)
GFR, Est African American: 78 mL/min
GFR, Est Non African American: 68 mL/min
Glucose, Bld: 94 mg/dL (ref 70–99)
Potassium: 4.5 mEq/L (ref 3.5–5.3)

## 2013-03-13 LAB — POCT URINALYSIS DIPSTICK
Glucose, UA: NEGATIVE
Leukocytes, UA: NEGATIVE
Nitrite, UA: NEGATIVE
Protein, UA: NEGATIVE
Spec Grav, UA: 1.015
Urobilinogen, UA: NEGATIVE

## 2013-03-13 LAB — LIPID PANEL
Cholesterol: 137 mg/dL (ref 0–200)
LDL Cholesterol: 74 mg/dL (ref 0–99)
Triglycerides: 80 mg/dL (ref <150)
VLDL: 16 mg/dL (ref 0–40)

## 2013-03-13 LAB — POCT CBC
Granulocyte percent: 66.5 %G (ref 37–80)
HCT, POC: 39.7 % — AB (ref 43.5–53.7)
Lymph, poc: 2.3 (ref 0.6–3.4)
MCHC: 32.8 g/dL (ref 31.8–35.4)
MCV: 88.1 fL (ref 80–97)
POC Granulocyte: 5.9 (ref 2–6.9)
Platelet Count, POC: 158 10*3/uL (ref 142–424)
RDW, POC: 15 %
WBC: 8.8 10*3/uL (ref 4.6–10.2)

## 2013-03-13 LAB — POCT UA - MICROSCOPIC ONLY
Bacteria, U Microscopic: NEGATIVE
Crystals, Ur, HPF, POC: NEGATIVE
Mucus, UA: NEGATIVE
RBC, urine, microscopic: NEGATIVE

## 2013-03-13 LAB — HEPATIC FUNCTION PANEL
ALT: 12 U/L (ref 0–53)
Albumin: 4.1 g/dL (ref 3.5–5.2)
Total Protein: 6.6 g/dL (ref 6.0–8.3)

## 2013-03-13 MED ORDER — TRIAMCINOLONE ACETONIDE 0.1 % EX CREA: TOPICAL_CREAM | Freq: Two times a day (BID) | CUTANEOUS | Status: DC

## 2013-03-13 NOTE — Progress Notes (Signed)
  Subjective:    Patient ID: [name] [name], male    DOB: 1915/01/21, 79 y.o.   MRN: [id number]  HPI This patient presents for recheck of multiple medical problems. .  Patient Active Problem List  Diagnosis  . HYPERLIPIDEMIA-MIXED  . CAD, UNSPECIFIED SITE  . ATRIAL FIBRILLATION  . EDEMA  . BENIGN PROSTATIC HYPERTROPHY, HX OF    In addition, need to recheck foreskin infection  The allergies, current medications, past medical history, surgical history, family and social history are reviewed.  Immunizations reviewed.  Health maintenance reviewed.       Review of Systems  Constitutional: Negative.   HENT: Negative.   Eyes: Negative.   Respiratory: Positive for cough (occ prod).   Cardiovascular: Negative.   Gastrointestinal: Negative.   Genitourinary: Positive for frequency and penile swelling. Negative for dysuria and flank pain.       Foreskin infection is improved after taking cipro  Musculoskeletal: Positive for back pain.  Neurological: Negative.   Psychiatric/Behavioral: Positive for confusion and sleep disturbance (occasional).       Objective:   Physical Exam  BP 139/86  Pulse 79  Temp(Src) 96.2 F (35.7 C) (Oral)  Ht 5' 5.75" (1.67 m)  Wt 178 lb (80.74 kg)  BMI 28.95 kg/m2  The patient appeared well nourished and normally developed, alert and oriented to time and place. Speech and behavior appear normal. Vital signs as documented.  Head exam is unremarkable. No scleral icterus or pallor noted.  Neck is without jugular venous distension, thyromegally, or carotid bruits. Carotid upstrokes are brisk bilaterally. No cervical adenopathy. Lungs are clear anteriorly and posteriorly to auscultation. Normal respiratory effort. Cardiac exam reveal an irregular rate and rhythm. First and second heart sounds normal. No murmurs, rubs or gallops.  Abdominal exam reveals normal bowl sounds, no masses, no organomegaly and no aortic enlargement. No inguinal  adenopathy.Foreskin edema and redness is resolving. Extremities are slightly edematous . Skin without pallor or jaundice.  Warm and dry, with rash on right side of chest and abdomen.. Neurologic exam reveals normal deep tendon reflexes and normal sensation.His memory is definitely declining.         Assessment & Plan:     1. Atrial fibrillation Coumadin therapy  2. CAD, UNSPECIFIED SITE stable - BASIC METABOLIC PANEL WITH GFR  3. HYPERLIPIDEMIA-MIXED  - Lipid panel - Thyroid Panel With TSH - Hepatic function panel  4. Foreskin inflammation Improved  5. Anemia  - POCT CBC  6. Unspecified vitamin D deficiency  - Vitamin D 25 hydroxy  7. Frequency of urination  - POCT urinalysis dipstick - POCT UA - Microscopic Only  8. Atopic dermatitis  - triamcinolone cream (KENALOG) 0.1 %; Apply topically 2 (two) times daily.  Dispense: 60 g; Refill: 1  9. Memory loss or impairment Patient may be moving in with sister soon

## 2013-03-13 NOTE — Patient Instructions (Addendum)
Fall prevention Make sure that [facility] pharmacy explains how to use medication Use cortisone cream on the right side 3-4 times daily as directed

## 2013-03-14 ENCOUNTER — Other Ambulatory Visit: Payer: Self-pay | Admitting: Family Medicine

## 2013-03-14 LAB — THYROID PANEL WITH TSH
Free Thyroxine Index: 2.5 (ref 1.0–3.9)
T3 Uptake: 35.3 % (ref 22.5–37.0)
TSH: 2.777 u[IU]/mL (ref 0.350–4.500)

## 2013-04-11 ENCOUNTER — Ambulatory Visit (INDEPENDENT_AMBULATORY_CARE_PROVIDER_SITE_OTHER): Payer: Medicare Other | Admitting: Pharmacist

## 2013-04-11 DIAGNOSIS — I4891 Unspecified atrial fibrillation: Secondary | ICD-10-CM

## 2013-04-11 NOTE — Addendum Note (Signed)
Addended by: Henrene Pastor on: 04/11/2013 09:34 AM   Modules accepted: Orders

## 2013-04-11 NOTE — Patient Instructions (Signed)
Anticoagulation Dose Instructions as of 04/11/2013     Glynis Smiles Tue Wed Thu Fri Sat   New Dose 4.5 mg 3 mg 4.5 mg 4.5 mg 4.5 mg 3 mg 4.5 mg    Description       Continue same dose of warfarin.      INR was 2.5 today

## 2013-05-14 ENCOUNTER — Other Ambulatory Visit: Payer: Self-pay | Admitting: *Deleted

## 2013-05-14 MED ORDER — ISOSORBIDE MONONITRATE ER 30 MG PO TB24: 30.0000 mg | ORAL_TABLET | Freq: Every day | ORAL | Status: DC

## 2013-05-16 ENCOUNTER — Ambulatory Visit (INDEPENDENT_AMBULATORY_CARE_PROVIDER_SITE_OTHER): Payer: Medicare Other | Admitting: Pharmacist

## 2013-05-16 ENCOUNTER — Other Ambulatory Visit: Payer: Self-pay

## 2013-05-16 DIAGNOSIS — I4891 Unspecified atrial fibrillation: Secondary | ICD-10-CM

## 2013-05-16 LAB — POCT INR: INR: 2.8

## 2013-05-16 MED ORDER — POTASSIUM CHLORIDE CRYS ER 20 MEQ PO TBCR: 20.0000 meq | EXTENDED_RELEASE_TABLET | Freq: Every day | ORAL | Status: DC

## 2013-05-29 ENCOUNTER — Other Ambulatory Visit: Payer: Self-pay | Admitting: *Deleted

## 2013-05-29 MED ORDER — METOPROLOL TARTRATE 50 MG PO TABS: 50.0000 mg | ORAL_TABLET | Freq: Two times a day (BID) | ORAL | Status: DC

## 2013-05-31 ENCOUNTER — Other Ambulatory Visit: Payer: Self-pay | Admitting: Family Medicine

## 2013-06-20 ENCOUNTER — Other Ambulatory Visit: Payer: Self-pay | Admitting: Family Medicine

## 2013-06-24 ENCOUNTER — Ambulatory Visit (INDEPENDENT_AMBULATORY_CARE_PROVIDER_SITE_OTHER): Payer: Medicare Other | Admitting: Pharmacist

## 2013-06-24 DIAGNOSIS — I4891 Unspecified atrial fibrillation: Secondary | ICD-10-CM

## 2013-06-24 LAB — POCT INR: INR: 3.5

## 2013-06-24 NOTE — Patient Instructions (Signed)
Anticoagulation Dose Instructions as of 06/24/2013     [name] [name] Tue Wed Thu Fri Sat   New Dose 4.5 mg 3 mg 4.5 mg 3 mg 4.5 mg 3 mg 4.5 mg    Description       Hold warfarin for 1 day, then decreased to 1 tablet MWF and 1 and 1/2 tablet all other days.        INR was 3.5 today

## 2013-07-02 ENCOUNTER — Other Ambulatory Visit: Payer: Self-pay | Admitting: Family Medicine

## 2013-07-11 ENCOUNTER — Ambulatory Visit (INDEPENDENT_AMBULATORY_CARE_PROVIDER_SITE_OTHER): Payer: Medicare Other | Admitting: Pharmacist

## 2013-07-11 DIAGNOSIS — I4891 Unspecified atrial fibrillation: Secondary | ICD-10-CM

## 2013-07-11 NOTE — Patient Instructions (Signed)
Anticoagulation Dose Instructions as of 07/11/2013     Glynis Smiles Tue Wed Thu Fri Sat   New Dose 4.5 mg 3 mg 4.5 mg 3 mg 4.5 mg 3 mg 4.5 mg    Description       continue 1 tablet MWF and 1 and 1/2 tablet all other days.        INR was 2.0 today

## 2013-08-14 ENCOUNTER — Telehealth: Payer: Self-pay | Admitting: Pharmacist

## 2013-08-14 NOTE — Telephone Encounter (Signed)
Patient wanted to know how [facility] knew when his warfarin dose has been changed. Called him and explained that we notify them when there has been a change in his dose and they adjust the warfarin in his pill boxes.  Reminded patient of appt tomorrow.

## 2013-08-15 ENCOUNTER — Ambulatory Visit (INDEPENDENT_AMBULATORY_CARE_PROVIDER_SITE_OTHER): Payer: Medicare Other | Admitting: Pharmacist

## 2013-08-15 DIAGNOSIS — I4891 Unspecified atrial fibrillation: Secondary | ICD-10-CM

## 2013-08-15 NOTE — Patient Instructions (Addendum)
Anticoagulation Dose Instructions as of 08/15/2013     Glynis Smiles Tue Wed Thu Fri Sat   New Dose 4.5 mg 3 mg 4.5 mg 3 mg 4.5 mg 3 mg 4.5 mg    Description       continue 1 tablet MWF and 1 and 1/2 tablet all other days.       INR was 2.0 today

## 2013-08-20 ENCOUNTER — Other Ambulatory Visit: Payer: Self-pay | Admitting: Family Medicine

## 2013-08-27 ENCOUNTER — Other Ambulatory Visit: Payer: Self-pay | Admitting: Family Medicine

## 2013-08-29 ENCOUNTER — Other Ambulatory Visit: Payer: Self-pay | Admitting: Family Medicine

## 2013-09-12 ENCOUNTER — Ambulatory Visit (INDEPENDENT_AMBULATORY_CARE_PROVIDER_SITE_OTHER): Payer: Self-pay | Admitting: Pharmacist

## 2013-09-12 ENCOUNTER — Ambulatory Visit (INDEPENDENT_AMBULATORY_CARE_PROVIDER_SITE_OTHER): Payer: Medicare Other

## 2013-09-12 ENCOUNTER — Encounter: Payer: Self-pay | Admitting: Family Medicine

## 2013-09-12 ENCOUNTER — Other Ambulatory Visit: Payer: Self-pay | Admitting: Pharmacist

## 2013-09-12 ENCOUNTER — Ambulatory Visit (INDEPENDENT_AMBULATORY_CARE_PROVIDER_SITE_OTHER): Payer: Medicare Other | Admitting: Family Medicine

## 2013-09-12 VITALS — BP 138/78 | HR 99 | Temp 97.0°F | Ht 67.5 in | Wt 178.0 lb

## 2013-09-12 DIAGNOSIS — Z87898 Personal history of other specified conditions: Secondary | ICD-10-CM

## 2013-09-12 DIAGNOSIS — I4891 Unspecified atrial fibrillation: Secondary | ICD-10-CM

## 2013-09-12 DIAGNOSIS — E559 Vitamin D deficiency, unspecified: Secondary | ICD-10-CM

## 2013-09-12 DIAGNOSIS — E785 Hyperlipidemia, unspecified: Secondary | ICD-10-CM

## 2013-09-12 DIAGNOSIS — I2510 Atherosclerotic heart disease of native coronary artery without angina pectoris: Secondary | ICD-10-CM

## 2013-09-12 DIAGNOSIS — Z23 Encounter for immunization: Secondary | ICD-10-CM

## 2013-09-12 DIAGNOSIS — D235 Other benign neoplasm of skin of trunk: Secondary | ICD-10-CM

## 2013-09-12 DIAGNOSIS — L600 Ingrowing nail: Secondary | ICD-10-CM

## 2013-09-12 DIAGNOSIS — D225 Melanocytic nevi of trunk: Secondary | ICD-10-CM

## 2013-09-12 LAB — POCT CBC
Granulocyte percent: 67.5 %G (ref 37–80)
HCT, POC: 39.1 % — AB (ref 43.5–53.7)
Hemoglobin: 12.9 g/dL — AB (ref 14.1–18.1)
MCHC: 32.9 g/dL (ref 31.8–35.4)
MPV: 7.3 fL (ref 0–99.8)
POC Granulocyte: 4.9 (ref 2–6.9)
POC LYMPH PERCENT: 23.2 %L (ref 10–50)
RBC: 4.4 M/uL — AB (ref 4.69–6.13)
WBC: 7.3 10*3/uL (ref 4.6–10.2)

## 2013-09-12 MED ORDER — CIPROFLOXACIN HCL 250 MG PO TABS: 250.0000 mg | ORAL_TABLET | Freq: Two times a day (BID) | ORAL | Status: DC

## 2013-09-12 NOTE — Patient Instructions (Signed)
Anticoagulation Dose Instructions as of 09/12/2013     [name] [name] Tue Wed Thu Fri Sat   New Dose 4.5 mg 3 mg 4.5 mg 3 mg 4.5 mg 3 mg 4.5 mg    Description       Conntinue 1 tablet on mondays, wednesdays and fridays and 1 and 1/2 tablet all other days.        INR was 2.7 today

## 2013-09-12 NOTE — Patient Instructions (Signed)
Continue current medications. Continue good therapeutic lifestyle changes.  Fall precautions discussed with patient. Schedule your flu vaccine the first of October. Follow up as planned and earlier as needed.   

## 2013-09-12 NOTE — Progress Notes (Signed)
Subjective:    Patient ID: [name] [name], male    DOB: 1915/09/08, 79 y.o.   MRN: [id number]  HPI Pt here for follow up and management of chronic medical problems and anticoagulation check up. He also complains of some pain in his right great toe. He saw the podiatrist recently and only for nail trimming. The patient still lives by himself at home.   Patient Active Problem List   Diagnosis Date Noted  . A-fib 04/11/2013  . HYPERLIPIDEMIA-MIXED 03/19/2009  . CAD, UNSPECIFIED SITE 03/19/2009  . ATRIAL FIBRILLATION 03/19/2009  . EDEMA 03/19/2009  . BENIGN PROSTATIC HYPERTROPHY, HX OF 03/19/2009   Outpatient Encounter Prescriptions as of 09/12/2013  Medication Sig Dispense Refill  . Calcium Carbonate-Vitamin D 600-400 MG-UNIT per tablet Take 1 tablet by mouth daily.        . Cholecalciferol (VITAMIN D) 2000 UNITS CAPS Take 1 capsule by mouth daily.      . Desoximetasone (TOPICORT) 0.25 % ointment Apply 1 application topically 2 (two) times daily.      Marland Kitchen docusate sodium (COLACE) 100 MG capsule Take 100 mg by mouth as needed.        . furosemide (LASIX) 20 MG tablet Take 20 mg by mouth daily.      . isosorbide mononitrate (IMDUR) 30 MG 24 hr tablet Take 1 tablet (30 mg total) by mouth daily.  90 tablet  1  . lisinopril (PRINIVIL,ZESTRIL) 10 MG tablet Take 10 mg by mouth at bedtime.        . metoprolol (LOPRESSOR) 50 MG tablet TAKE  (1)  TABLET TWICE A DAY.  180 tablet  1  . Omega-3 Fatty Acids (FISH OIL) 1000 MG CAPS Take 3 capsules by mouth daily. 1000 mg?       . omeprazole (PRILOSEC) 20 MG capsule TAKE (1) CAPSULE DAILY  30 capsule  5  . potassium chloride SA (K-DUR,KLOR-CON) 20 MEQ tablet TAKE 1 TABLET ONCE A DAY  90 tablet  0  . senna (SENOKOT) 8.6 MG tablet Take 1 tablet by mouth daily. prn      . simvastatin (ZOCOR) 20 MG tablet Take 40 mg by mouth at bedtime.       . triamcinolone cream (KENALOG) 0.1 % Apply topically 2 (two) times daily.  60 g  1  . warfarin (COUMADIN) 3 MG  tablet TAKE 1.5 TABLETS DAILY EXCEPT 1 ON MONDAY, WEDNESDAY, AND FRIDAY  45 tablet  2   No facility-administered encounter medications on file as of 09/12/2013.        Review of Systems  Constitutional: Negative.   HENT: Negative.   Eyes: Negative.   Respiratory: Negative.   Cardiovascular: Negative.   Gastrointestinal: Negative.   Endocrine: Negative.   Genitourinary: Negative.   Musculoskeletal: Negative.        Pain in right great toe.  Skin: Negative.   Allergic/Immunologic: Negative.   Neurological: Negative.   Hematological: Negative.   Psychiatric/Behavioral: Negative.        Objective:   Physical Exam  Nursing note and vitals reviewed. Constitutional: He is oriented to person, place, and time. He appears well-developed and well-nourished. No distress.  For his age  HENT:  Head: Normocephalic and atraumatic.  Right Ear: External ear normal.  Left Ear: External ear normal.  Nose: Nose normal.  Mouth/Throat: Oropharynx is clear and moist. No oropharyngeal exudate.  Eyes: Conjunctivae and EOM are normal. Pupils are equal, round, and reactive to light. Right eye exhibits no discharge. Left eye  exhibits no discharge. No scleral icterus.  Neck: Normal range of motion. Neck supple. No thyromegaly present.  Cardiovascular: Normal rate and normal heart sounds.  Exam reveals no gallop and no friction rub.   No murmur heard. Irregular irregular rate and rhythm at 72-84 per minute  Pulmonary/Chest: Effort normal and breath sounds normal. He has no wheezes. He has no rales. He exhibits no tenderness.  Abdominal: Soft. Bowel sounds are normal. He exhibits no mass. There is no tenderness. There is no rebound and no guarding.  Genitourinary: Penis normal.  Foreskin cellulitis resolved  Musculoskeletal: He exhibits edema (1+ pretibial bilaterally). He exhibits no tenderness.  Range of motion is hesitant and slow secondary to arthritis. He uses a cane for mobility.   Lymphadenopathy:    He has no cervical adenopathy.  Neurological: He is alert and oriented to person, place, and time. He has normal reflexes. No cranial nerve deficit.  Skin: Skin is warm and dry. No rash noted. No erythema. No pallor.  Suspicious skin lesion mid back left lateral thoracic spine Arrange for excision of this  Psychiatric: He has a normal mood and affect. His behavior is normal. Judgment and thought content normal.   BP 138/78  Pulse 99  Temp(Src) 97 F (36.1 C) (Oral)  Ht 5' 7.5" (1.715 m)  Wt 178 lb (80.74 kg)  BMI 27.45 kg/m2   [facility] reading (PRIMARY) by  Dr. Christell Constant: Chest x-ray-enlarged heart, eventration of the right diaphragm                                     Assessment & Plan:   1. A-fib   2. HYPERLIPIDEMIA-MIXED   3. CAD, UNSPECIFIED SITE   4. Atrial fibrillation   5. BENIGN PROSTATIC HYPERTROPHY, HX OF   6. Vitamin D deficiency   7. Nevus of upper back excluding scapular region    Orders Placed This Encounter  Procedures  . DG Chest 2 View    Standing Status: Future     Number of Occurrences:      Standing Expiration Date: 11/12/2014    Order Specific Question:  Reason for Exam (SYMPTOM  OR DIAGNOSIS REQUIRED)    Answer:  2 years    Order Specific Question:  Preferred imaging location?    Answer:  Internal  . Lipid panel  . Hepatic function panel  . BMP8+EGFR  . Vit D  25 hydroxy (rtn osteoporosis monitoring)  . POCT CBC   Meds ordered this encounter  Medications  . ciprofloxacin (CIPRO) 250 MG tablet    Sig: Take 1 tablet (250 mg total) by mouth 2 (two) times daily.    Dispense:  20 tablet    Refill:  0   Nyra Capes MD

## 2013-09-13 LAB — HEPATIC FUNCTION PANEL
ALT: 12 IU/L (ref 0–44)
AST: 23 IU/L (ref 0–40)
Total Bilirubin: 0.4 mg/dL (ref 0.0–1.2)

## 2013-09-13 LAB — BMP8+EGFR
BUN: 22 mg/dL (ref 10–36)
CO2: 30 mmol/L — ABNORMAL HIGH (ref 18–29)
Calcium: 9.1 mg/dL (ref 8.6–10.2)
Chloride: 102 mmol/L (ref 97–108)
Creatinine, Ser: 0.9 mg/dL (ref 0.76–1.27)
Glucose: 92 mg/dL (ref 65–99)

## 2013-09-13 LAB — LIPID PANEL
Cholesterol, Total: 138 mg/dL (ref 100–199)
HDL: 53 mg/dL (ref >39)
LDL Calculated: 68 mg/dL (ref 0–99)
Triglycerides: 83 mg/dL (ref 0–149)
VLDL Cholesterol Cal: 17 mg/dL (ref 5–40)

## 2013-09-13 LAB — VITAMIN D 25 HYDROXY (VIT D DEFICIENCY, FRACTURES): Vit D, 25-Hydroxy: 43.6 ng/mL (ref 30.0–100.0)

## 2013-09-27 ENCOUNTER — Ambulatory Visit (INDEPENDENT_AMBULATORY_CARE_PROVIDER_SITE_OTHER): Payer: Medicare Other | Admitting: Physician Assistant

## 2013-09-27 ENCOUNTER — Encounter: Payer: Self-pay | Admitting: Physician Assistant

## 2013-09-27 ENCOUNTER — Encounter (INDEPENDENT_AMBULATORY_CARE_PROVIDER_SITE_OTHER): Payer: Self-pay

## 2013-09-27 VITALS — BP 140/81 | HR 89 | Temp 96.6°F | Ht 67.0 in | Wt 179.0 lb

## 2013-09-27 DIAGNOSIS — L821 Other seborrheic keratosis: Secondary | ICD-10-CM

## 2013-10-06 NOTE — Progress Notes (Signed)
  Subjective:    Patient ID: [name] [name], male    DOB: 04-Dec-1915, 79 y.o.   MRN: [id number]  HPI 79 y/o male presents with hyperpigmented lesions on back. He is unaware of lesions that are of concern and states that he is asymptomatic. Denies pain, bleeding, irritation from lesions.     Review of Systems  Constitutional: Negative.   Skin: Negative.        Objective:   Physical Exam  Constitutional: He appears well-developed.  Skin: Skin is intact. Lesion (numerous seborrheic keratosis lesions on back with some actinic changes and hyperkeratosis present) noted. No abrasion, no bruising, no burn, no ecchymosis, no laceration, no petechiae and no rash noted. He is not diaphoretic. No erythema. No pallor.     Psychiatric: He has a normal mood and affect. His behavior is normal.          Assessment & Plan:  1. Seborrheic keratosis: Patient was unaware of lesion of concern for referral to see me, however, on evaluation of trunk, I did not identify any lesions of concern. Patient has numerous seb k's on back with some actinic background changes nad hyperkeratosis. 3 of the lesions were treated with cryotherapy. Since seb K's are benign lesions and asymptomatic to the patient, I did not see a need to treat further. Patient was instructed on proper care of the lesions post procedure. He will rtc if needed.

## 2013-10-17 ENCOUNTER — Telehealth: Payer: Self-pay | Admitting: Pharmacist

## 2013-10-17 ENCOUNTER — Ambulatory Visit (INDEPENDENT_AMBULATORY_CARE_PROVIDER_SITE_OTHER): Payer: Medicare Other | Admitting: Pharmacist

## 2013-10-17 DIAGNOSIS — I4891 Unspecified atrial fibrillation: Secondary | ICD-10-CM

## 2013-10-17 DIAGNOSIS — D696 Thrombocytopenia, unspecified: Secondary | ICD-10-CM

## 2013-10-17 LAB — POCT CBC
HCT, POC: 41.2 % — AB (ref 43.5–53.7)
Hemoglobin: 13.3 g/dL — AB (ref 14.1–18.1)
MCH, POC: 28.3 pg (ref 27–31.2)
MCHC: 32.2 g/dL (ref 31.8–35.4)
MCV: 87.8 fL (ref 80–97)
RDW, POC: 15 %
WBC: 7.5 10*3/uL (ref 4.6–10.2)

## 2013-10-17 LAB — POCT INR: INR: 2.6

## 2013-10-17 NOTE — Telephone Encounter (Signed)
Patient notified of results of CBC 

## 2013-10-17 NOTE — Progress Notes (Signed)
Patient's platelets were decreased when last checked by Dr Christell Constant in September 2014.  Rechecking as requested.

## 2013-10-17 NOTE — Patient Instructions (Signed)
Anticoagulation Dose Instructions as of 10/17/2013     Glynis Smiles Tue Wed Thu Fri Sat   New Dose 4.5 mg 3 mg 4.5 mg 3 mg 4.5 mg 3 mg 4.5 mg    Description       Conntinue 1 tablet on mondays, wednesdays and fridays and 1 and 1/2 tablet all other days.       INR was 2.6 today

## 2013-10-31 ENCOUNTER — Other Ambulatory Visit: Payer: Self-pay | Admitting: Family Medicine

## 2013-11-20 ENCOUNTER — Other Ambulatory Visit: Payer: Self-pay | Admitting: Family Medicine

## 2013-11-25 ENCOUNTER — Other Ambulatory Visit: Payer: Self-pay | Admitting: Family Medicine

## 2013-11-29 ENCOUNTER — Other Ambulatory Visit: Payer: Self-pay | Admitting: Family Medicine

## 2013-12-04 ENCOUNTER — Ambulatory Visit (INDEPENDENT_AMBULATORY_CARE_PROVIDER_SITE_OTHER): Payer: Medicare Other | Admitting: Pharmacist

## 2013-12-04 DIAGNOSIS — I4891 Unspecified atrial fibrillation: Secondary | ICD-10-CM

## 2013-12-04 LAB — POCT INR: INR: 2.7

## 2013-12-04 NOTE — Patient Instructions (Signed)
Anticoagulation Dose Instructions as of 12/04/2013     [name] [name] Tue Wed Thu Fri Sat   New Dose 4.5 mg 3 mg 4.5 mg 3 mg 4.5 mg 3 mg 4.5 mg    Description       Conntinue 1 tablet on mondays, wednesdays and fridays and 1 and 1/2 tablet all other days.       INR was 2.7 today

## 2013-12-17 ENCOUNTER — Other Ambulatory Visit: Payer: Self-pay | Admitting: Family Medicine

## 2014-01-09 ENCOUNTER — Ambulatory Visit (INDEPENDENT_AMBULATORY_CARE_PROVIDER_SITE_OTHER): Payer: Medicare Other | Admitting: Pharmacist

## 2014-01-09 DIAGNOSIS — I4891 Unspecified atrial fibrillation: Secondary | ICD-10-CM

## 2014-01-09 LAB — POCT INR: INR: 3.1

## 2014-01-09 NOTE — Patient Instructions (Signed)
Anticoagulation Dose Instructions as of 01/09/2014     Dorene Grebe Tue Wed Thu Fri Sat   New Dose 4.5 mg 3 mg 4.5 mg 3 mg 4.5 mg 3 mg 4.5 mg    Description       1/2 tablet on Wednesday 01/15/14. Then continue 1 tablet on mondays, wednesdays and fridays and 1 and 1/2 tablet all other days.       INR was 3.1 today

## 2014-01-20 ENCOUNTER — Encounter: Payer: Self-pay | Admitting: Family Medicine

## 2014-01-20 ENCOUNTER — Ambulatory Visit (INDEPENDENT_AMBULATORY_CARE_PROVIDER_SITE_OTHER): Payer: Medicare Other | Admitting: Family Medicine

## 2014-01-20 VITALS — BP 131/81 | HR 72 | Temp 97.1°F | Ht 67.0 in | Wt 171.0 lb

## 2014-01-20 DIAGNOSIS — I4891 Unspecified atrial fibrillation: Secondary | ICD-10-CM

## 2014-01-20 DIAGNOSIS — E559 Vitamin D deficiency, unspecified: Secondary | ICD-10-CM

## 2014-01-20 DIAGNOSIS — Z87898 Personal history of other specified conditions: Secondary | ICD-10-CM

## 2014-01-20 DIAGNOSIS — I2510 Atherosclerotic heart disease of native coronary artery without angina pectoris: Secondary | ICD-10-CM

## 2014-01-20 DIAGNOSIS — Z23 Encounter for immunization: Secondary | ICD-10-CM

## 2014-01-20 DIAGNOSIS — E785 Hyperlipidemia, unspecified: Secondary | ICD-10-CM

## 2014-01-20 LAB — POCT CBC
GRANULOCYTE PERCENT: 60.9 % (ref 37–80)
HEMATOCRIT: 40.7 % — AB (ref 43.5–53.7)
Hemoglobin: 13.2 g/dL — AB (ref 14.1–18.1)
Lymph, poc: 2.5 (ref 0.6–3.4)
MCH, POC: 28.8 pg (ref 27–31.2)
MCHC: 32.3 g/dL (ref 31.8–35.4)
MCV: 89 fL (ref 80–97)
MPV: 7.8 fL (ref 0–99.8)
PLATELET COUNT, POC: 162 10*3/uL (ref 142–424)
POC GRANULOCYTE: 4.5 (ref 2–6.9)
POC LYMPH PERCENT: 33.6 %L (ref 10–50)
RBC: 4.6 M/uL — AB (ref 4.69–6.13)
RDW, POC: 15.2 %
WBC: 7.4 10*3/uL (ref 4.6–10.2)

## 2014-01-20 NOTE — Patient Instructions (Addendum)
Continue current medications. Continue good therapeutic lifestyle changes which include good diet and exercise. Fall precautions discussed with patient.  use your walker at home Schedule your flu vaccine if you haven't had it yet If you are over 78 years old - you may need Prevnar 13 or the adult Pneumonia vaccine. Please check with insurance regarding the cost of Tdap M.D. FOBT we will call you with the results of the lab work once it is available

## 2014-01-20 NOTE — Progress Notes (Signed)
Subjective:    Patient ID: [name] [name], male    DOB: November 15, 1915, 80 y.o.   MRN: [id number]  HPI Pt here for follow up and management of chronic medical problems. Patient comes in today with no complaints. He is a little frustrated with waiting to see [place] today. He sees a clinical pharmacist regularly for his protimes and medication management. He is to do a rectal exam today. He will be given FOBT today. He will have lab work today. He also will receive a Prevnar today.      Patient Active Problem List   Diagnosis Date Noted  . HYPERLIPIDEMIA-MIXED 03/19/2009  . CAD, UNSPECIFIED SITE 03/19/2009  . ATRIAL FIBRILLATION 03/19/2009  . EDEMA 03/19/2009  . BENIGN PROSTATIC HYPERTROPHY, HX OF 03/19/2009   Outpatient Encounter Prescriptions as of 01/20/2014  Medication Sig  . Calcium Carbonate-Vitamin D 600-400 MG-UNIT per tablet Take 1 tablet by mouth daily.    . Cholecalciferol (VITAMIN D) 2000 UNITS CAPS Take 1 capsule by mouth daily.  . Desoximetasone (TOPICORT) 0.25 % ointment Apply 1 application topically 2 (two) times daily.  Marland Kitchen docusate sodium (COLACE) 100 MG capsule Take 100 mg by mouth as needed.    . furosemide (LASIX) 20 MG tablet TAKE 1 TABLET DAILY  . isosorbide mononitrate (IMDUR) 30 MG 24 hr tablet TAKE 1 TABLET ONCE A DAY  . lisinopril (PRINIVIL,ZESTRIL) 10 MG tablet TAKE 1 TABLET ONCE A DAY  . metoprolol (LOPRESSOR) 50 MG tablet TAKE  (1)  TABLET TWICE A DAY.  Marland Kitchen Omega-3 Fatty Acids (FISH OIL) 1000 MG CAPS Take 3 capsules by mouth daily. 1000 mg?   . omeprazole (PRILOSEC) 20 MG capsule TAKE (1) CAPSULE DAILY  . potassium chloride SA (K-DUR,KLOR-CON) 20 MEQ tablet TAKE 1 TABLET ONCE A DAY  . senna (SENOKOT) 8.6 MG tablet Take 1 tablet by mouth daily. prn  . simvastatin (ZOCOR) 40 MG tablet TAKE 1 TABLET IN THE EVENING FOR CHOLESTEROL  . triamcinolone cream (KENALOG) 0.1 % Apply topically 2 (two) times daily.  Marland Kitchen warfarin (COUMADIN) 3 MG tablet TAKE 1.5 TABLETS DAILY  EXCEPT 1 ON MON, WED, AND FRI    Review of Systems  Constitutional: Negative.   HENT: Negative.   Eyes: Negative.   Respiratory: Negative.   Cardiovascular: Negative.   Gastrointestinal: Negative.   Endocrine: Negative.   Genitourinary: Negative.   Musculoskeletal: Negative.   Skin: Negative.   Allergic/Immunologic: Negative.   Neurological: Negative.   Hematological: Negative.   Psychiatric/Behavioral: Negative.        Objective:   Physical Exam  Nursing note and vitals reviewed. Constitutional: He is oriented to person, place, and time. He appears well-developed and well-nourished. No distress.  Patient appears to be getting more feeble.  HENT:  Head: Normocephalic and atraumatic.  Right Ear: External ear normal.  Left Ear: External ear normal.  Nose: Nose normal.  Mouth/Throat: Oropharynx is clear and moist. No oropharyngeal exudate.  Minimal ear cerumen bilaterally  Eyes: Conjunctivae and EOM are normal. Pupils are equal, round, and reactive to light. Right eye exhibits no discharge. Left eye exhibits no discharge. No scleral icterus.  Neck: Normal range of motion. Neck supple. No tracheal deviation present. No thyromegaly present.  Cardiovascular: Normal rate, normal heart sounds and intact distal pulses.   No murmur heard. Regular rhythm at 72 per minute  Pulmonary/Chest: Effort normal and breath sounds normal. No respiratory distress. He has no wheezes. He has no rales. He exhibits no tenderness.  Abdominal: Soft.  Bowel sounds are normal. He exhibits no mass. There is no tenderness. There is no rebound and no guarding.  Genitourinary: Rectum normal and penis normal.  The prostate is enlarged. The external genitalia appeared normal without masses. There was no rectal mass. He does have some hemorrhoids present.  Musculoskeletal: He exhibits no edema and no tenderness.  Range of motion is limited due to arthritic changes and patient  use of a cane  Lymphadenopathy:     He has no cervical adenopathy.  Neurological: He is alert and oriented to person, place, and time. He has normal reflexes. No cranial nerve deficit.  Skin: Skin is warm and dry. No rash noted. No erythema. No pallor.  The atopic dermatitis on abdomen, no change  Psychiatric: He has a normal mood and affect. His behavior is normal.  Impatience, may be some declining problems with memory and orientation. The patient still lives by himself at home.   BP 131/81  Pulse 72  Temp(Src) 97.1 F (36.2 C) (Oral)  Ht _0  (1.702 m)  Wt 171 lb (77.565 kg)  BMI 26.78 kg/m2        Assessment & Plan:  1. Atrial fibrillation - POCT CBC  2. BENIGN PROSTATIC HYPERTROPHY, HX OF - POCT CBC  3. CAD, UNSPECIFIED SITE - POCT CBC - BMP8+EGFR - Hepatic function panel  4. HYPERLIPIDEMIA-MIXED - POCT CBC - Lipid panel  5. Vitamin D deficiency - Vit D  25 hydroxy (rtn osteoporosis monitoring)  Patient Instructions  Continue current medications. Continue good therapeutic lifestyle changes which include good diet and exercise. Fall precautions discussed with patient.  use your walker at home Schedule your flu vaccine if you haven't had it yet If you are over 67 years old - you may need Prevnar 13 or the adult Pneumonia vaccine. Please check with insurance regarding the cost of Tdap M.D. FOBT we will call you with the results of the lab work once it is available     Arrie Senate MD

## 2014-01-20 NOTE — Addendum Note (Signed)
Addended by: Zannie Cove on: 01/20/2014 12:26 PM   Modules accepted: Orders

## 2014-01-21 LAB — LIPID PANEL
Chol/HDL Ratio: 2.8 ratio units (ref 0.0–5.0)
Cholesterol, Total: 142 mg/dL (ref 100–199)
HDL: 51 mg/dL (ref >39)
LDL Calculated: 73 mg/dL (ref 0–99)
Triglycerides: 90 mg/dL (ref 0–149)
VLDL Cholesterol Cal: 18 mg/dL (ref 5–40)

## 2014-01-21 LAB — BMP8+EGFR
BUN/Creatinine Ratio: 17 (ref 10–22)
BUN: 17 mg/dL (ref 10–36)
CO2: 26 mmol/L (ref 18–29)
CREATININE: 1.03 mg/dL (ref 0.76–1.27)
Calcium: 9.5 mg/dL (ref 8.6–10.2)
Chloride: 99 mmol/L (ref 97–108)
GFR calc Af Amer: 71 mL/min/{1.73_m2} (ref >59)
GFR calc non Af Amer: 61 mL/min/{1.73_m2} (ref >59)
GLUCOSE: 103 mg/dL — AB (ref 65–99)
Potassium: 4.6 mmol/L (ref 3.5–5.2)
Sodium: 142 mmol/L (ref 134–144)

## 2014-01-21 LAB — HEPATIC FUNCTION PANEL
ALBUMIN: 4.4 g/dL (ref 3.2–4.6)
ALT: 11 IU/L (ref 0–44)
AST: 19 IU/L (ref 0–40)
Alkaline Phosphatase: 62 IU/L (ref 39–117)
Bilirubin, Direct: 0.11 mg/dL (ref 0.00–0.40)
TOTAL PROTEIN: 6.8 g/dL (ref 6.0–8.5)
Total Bilirubin: 0.4 mg/dL (ref 0.0–1.2)

## 2014-01-21 LAB — VITAMIN D 25 HYDROXY (VIT D DEFICIENCY, FRACTURES): Vit D, 25-Hydroxy: 31.6 ng/mL (ref 30.0–100.0)

## 2014-01-23 ENCOUNTER — Telehealth: Payer: Self-pay | Admitting: *Deleted

## 2014-01-23 NOTE — Telephone Encounter (Signed)
Aware of good labs just needs to take 2000 u of vitamin D daily.

## 2014-01-23 NOTE — Telephone Encounter (Signed)
Message copied by Shelbie Ammons on Thu Jan 23, 2014  1:00 PM ------      Message from: Chipper Herb      Created: Tue Jan 21, 2014  1:58 PM       The blood sugar slightly elevated at 103. The kidney function test are good and the electrolytes are within normal limits including potassium.      Liver function tests are within normal      All cholesterol numbers by traditional lipid testing are excellent and at goal----continue current treatment and diet      The vitamin D level is at the low end of the normal range--- make sure that the patient is taking vitamin D3 2000 on a regular basis, we may want to increase this but only by the pharmacist since they prepare his medication ------

## 2014-02-03 ENCOUNTER — Other Ambulatory Visit: Payer: Self-pay | Admitting: Family Medicine

## 2014-02-12 ENCOUNTER — Other Ambulatory Visit (INDEPENDENT_AMBULATORY_CARE_PROVIDER_SITE_OTHER): Payer: Medicare Other

## 2014-02-12 ENCOUNTER — Ambulatory Visit (INDEPENDENT_AMBULATORY_CARE_PROVIDER_SITE_OTHER): Payer: Medicare Other | Admitting: Pharmacist

## 2014-02-12 DIAGNOSIS — I4891 Unspecified atrial fibrillation: Secondary | ICD-10-CM

## 2014-02-12 LAB — POCT INR: INR: 2.4

## 2014-02-12 NOTE — Progress Notes (Signed)
Pt came in for labs only 

## 2014-02-12 NOTE — Patient Instructions (Signed)
Anticoagulation Dose Instructions as of 02/12/2014     [name] [name] Tue Wed Thu Fri Sat   New Dose 4.5 mg 3 mg 4.5 mg 3 mg 4.5 mg 3 mg 4.5 mg    Description       Continue 1 tablet on mondays, wednesdays and fridays and 1 and 1/2 tablet all other days.       INR was 2.5 today

## 2014-02-18 ENCOUNTER — Other Ambulatory Visit: Payer: Self-pay | Admitting: Family Medicine

## 2014-02-26 ENCOUNTER — Other Ambulatory Visit: Payer: Self-pay | Admitting: Physician Assistant

## 2014-03-13 ENCOUNTER — Ambulatory Visit (INDEPENDENT_AMBULATORY_CARE_PROVIDER_SITE_OTHER): Payer: Medicare Other | Admitting: Pharmacist

## 2014-03-13 DIAGNOSIS — I4891 Unspecified atrial fibrillation: Secondary | ICD-10-CM

## 2014-03-13 LAB — POCT INR: INR: 2.5

## 2014-03-13 NOTE — Patient Instructions (Signed)
Anticoagulation Dose Instructions as of 03/13/2014     [name] [name] Tue Wed Thu Fri Sat   New Dose 4.5 mg 3 mg 4.5 mg 3 mg 4.5 mg 3 mg 4.5 mg    Description       Continue 1 tablet on mondays, wednesdays and fridays and 1 and 1/2 tablet all other days.       INR was 2.5 today

## 2014-03-17 ENCOUNTER — Other Ambulatory Visit: Payer: Self-pay | Admitting: Family Medicine

## 2014-03-19 ENCOUNTER — Ambulatory Visit (INDEPENDENT_AMBULATORY_CARE_PROVIDER_SITE_OTHER): Payer: Medicare Other | Admitting: General Practice

## 2014-03-19 VITALS — BP 105/59 | HR 66 | Temp 98.9°F | Ht 67.0 in | Wt 179.0 lb

## 2014-03-19 DIAGNOSIS — L0390 Cellulitis, unspecified: Secondary | ICD-10-CM

## 2014-03-19 DIAGNOSIS — L0291 Cutaneous abscess, unspecified: Secondary | ICD-10-CM

## 2014-03-19 MED ORDER — DOXYCYCLINE HYCLATE 100 MG PO TABS: 100.0000 mg | ORAL_TABLET | Freq: Two times a day (BID) | ORAL | Status: DC

## 2014-03-19 NOTE — Progress Notes (Signed)
   Subjective:    Patient ID: [name] [name], male    DOB: 14-Aug-1915, 80 y.o.   MRN: [id number]  HPI Patient presents today with complaints of bilateral lower leg redness and swelling. Onset was yesterday. Denies drainage or known injury.     Review of Systems  Constitutional: Negative for fever and chills.  Respiratory: Negative for chest tightness and shortness of breath.   Cardiovascular: Negative for chest pain and palpitations.  Skin:       Redness to both lower legs       Objective:   Physical Exam  Constitutional: He is oriented to person, place, and time. He appears well-developed and well-nourished.  Cardiovascular: Normal rate, regular rhythm and normal heart sounds.   Pulmonary/Chest: Effort normal and breath sounds normal.  Neurological: He is alert and oriented to person, place, and time.  Skin: Skin is warm and dry.  Erythema, edema +1 non pitting, and warmth noted to bilateral lower legs          Assessment & Plan:  1. Cellulitis - doxycycline (VIBRA-TABS) 100 MG tablet; Take 1 tablet (100 mg total) by mouth 2 (two) times daily.  Dispense: 14 tablet; Refill: 0 -home care instructions provided and discussed  -RTO if symptoms worsen or unresolved -Patient and verbalized understanding Erby Pian, FNP-C

## 2014-03-19 NOTE — Patient Instructions (Signed)
Cellulitis Cellulitis is an infection of the skin and the tissue beneath it. The infected area is usually red and tender. Cellulitis occurs most often in the arms and lower legs.  CAUSES  Cellulitis is caused by bacteria that enter the skin through cracks or cuts in the skin. The most common types of bacteria that cause cellulitis are Staphylococcus and Streptococcus. SYMPTOMS   Redness and warmth.  Swelling.  Tenderness or pain.  Fever. DIAGNOSIS  Your caregiver can usually determine what is wrong based on a physical exam. Blood tests may also be done. TREATMENT  Treatment usually involves taking an antibiotic medicine. HOME CARE INSTRUCTIONS   Take your antibiotics as directed. Finish them even if you start to feel better.  Keep the infected arm or leg elevated to reduce swelling.  Apply a warm cloth to the affected area up to 4 times per day to relieve pain.  Only take over-the-counter or prescription medicines for pain, discomfort, or fever as directed by your caregiver.  Keep all follow-up appointments as directed by your caregiver. SEEK MEDICAL CARE IF:   You notice red streaks coming from the infected area.  Your red area gets larger or turns dark in color.  Your bone or joint underneath the infected area becomes painful after the skin has healed.  Your infection returns in the same area or another area.  You notice a swollen bump in the infected area.  You develop new symptoms. SEEK IMMEDIATE MEDICAL CARE IF:   You have a fever.  You feel very sleepy.  You develop vomiting or diarrhea.  You have a general ill feeling (malaise) with muscle aches and pains. MAKE SURE YOU:   Understand these instructions.  Will watch your condition.  Will get help right away if you are not doing well or get worse. Document Released: 09/14/2005 Document Revised: 06/05/2012 Document Reviewed: 02/20/2012 [employer] Patient Information 2014 [employer]  

## 2014-03-27 ENCOUNTER — Ambulatory Visit (INDEPENDENT_AMBULATORY_CARE_PROVIDER_SITE_OTHER): Payer: Medicare Other | Admitting: Family Medicine

## 2014-03-27 ENCOUNTER — Ambulatory Visit (INDEPENDENT_AMBULATORY_CARE_PROVIDER_SITE_OTHER): Payer: Medicare Other | Admitting: Pharmacist

## 2014-03-27 ENCOUNTER — Other Ambulatory Visit: Payer: Self-pay | Admitting: Physician Assistant

## 2014-03-27 ENCOUNTER — Encounter: Payer: Self-pay | Admitting: Family Medicine

## 2014-03-27 ENCOUNTER — Telehealth: Payer: Self-pay | Admitting: General Practice

## 2014-03-27 VITALS — BP 125/62 | HR 87 | Temp 97.7°F | Ht 65.0 in | Wt 177.0 lb

## 2014-03-27 DIAGNOSIS — L0390 Cellulitis, unspecified: Secondary | ICD-10-CM

## 2014-03-27 DIAGNOSIS — I4891 Unspecified atrial fibrillation: Secondary | ICD-10-CM

## 2014-03-27 DIAGNOSIS — L0291 Cutaneous abscess, unspecified: Secondary | ICD-10-CM

## 2014-03-27 LAB — POCT INR: INR: 3.4

## 2014-03-27 MED ORDER — FUROSEMIDE 40 MG PO TABS: ORAL_TABLET | ORAL | Status: DC

## 2014-03-27 MED ORDER — DOXYCYCLINE HYCLATE 100 MG PO TABS: 100.0000 mg | ORAL_TABLET | Freq: Two times a day (BID) | ORAL | Status: DC

## 2014-03-27 MED ORDER — NYSTATIN-TRIAMCINOLONE 100000-0.1 UNIT/GM-% EX OINT: 1.0000 "application " | TOPICAL_OINTMENT | Freq: Two times a day (BID) | CUTANEOUS | Status: DC

## 2014-03-27 NOTE — Telephone Encounter (Signed)
Appt made for 2:30.  Patient aware.

## 2014-03-27 NOTE — Progress Notes (Signed)
   Subjective:    Patient ID: [name] [name], male    DOB: 01-Mar-1915, 80 y.o.   MRN: [id number]  HPI  This 80 y.o. male presents for evaluation of cellulitis of the lower extremities bilateral.  He has  Been swelling in the legs.  He has been on coumadin for atrial fibrillation and he needs a PTINR.  Review of Systems C/o cellulitis No chest pain, SOB, HA, dizziness, vision change, N/V, diarrhea, constipation, dysuria, urinary urgency or frequency, myalgias, arthralgias or rash.     Objective:   Physical Exam Vital signs noted  Well developed well nourished male.  HEENT - Head atraumatic Normocephalic                Eyes - PERRLA, Conjuctiva - clear Sclera- Clear EOMI Respiratory - Lungs CTA bilateral Cardiac - Irregular rate and rhythm s1 and s2 w/o Murmur GI - Abdomen soft Nontender and bowel sounds active x 4 Extremities - 1 plus pre-tibial edema and Erythema up to mid calf region       Assessment & Plan:  Cellulitis - Plan: furosemide (LASIX) 40 MG tablet, doxycycline (VIBRA-TABS) 100 MG tablet, nystatin-triamcinolone ointment (MYCOLOG)  Atrial fibrillation - Plan: POCT INR  Follow up in one week  Lysbeth Penner FNP

## 2014-03-27 NOTE — Patient Instructions (Signed)
Anticoagulation Dose Instructions as of 03/27/2014     Dorene Grebe Tue Wed Thu Fri Sat   New Dose 3 mg 3 mg 4.5 mg 3 mg 4.5 mg 3 mg 4.5 mg    Description       No warfarin today.   Continue 1 tablet on Sundays, mondays, wednesdays and fridays and 1 and 1/2 tablet all other days.       INR was 3.4 today

## 2014-03-31 ENCOUNTER — Ambulatory Visit (INDEPENDENT_AMBULATORY_CARE_PROVIDER_SITE_OTHER): Payer: Medicare Other | Admitting: Family Medicine

## 2014-03-31 ENCOUNTER — Encounter: Payer: Self-pay | Admitting: Family Medicine

## 2014-03-31 ENCOUNTER — Ambulatory Visit (INDEPENDENT_AMBULATORY_CARE_PROVIDER_SITE_OTHER): Payer: Medicare Other | Admitting: Pharmacist

## 2014-03-31 VITALS — BP 125/72 | HR 114 | Temp 97.0°F | Ht 65.0 in | Wt 177.0 lb

## 2014-03-31 DIAGNOSIS — L0390 Cellulitis, unspecified: Secondary | ICD-10-CM

## 2014-03-31 DIAGNOSIS — I4891 Unspecified atrial fibrillation: Secondary | ICD-10-CM

## 2014-03-31 DIAGNOSIS — L0291 Cutaneous abscess, unspecified: Secondary | ICD-10-CM

## 2014-03-31 LAB — POCT INR: INR: 2.8

## 2014-03-31 NOTE — Progress Notes (Signed)
   Subjective:    Patient ID: [name] [name], male    DOB: [birth year], 80 y.o.   MRN: [id number]  HPI This 80 y.o. male presents for evaluation of follow up on cellulitis of the left lower extremity. He has been tx'd with doxycycline and is doing better.   Review of Systems    No chest pain, SOB, HA, dizziness, vision change, N/V, diarrhea, constipation, dysuria, urinary urgency or frequency, myalgias, arthralgias or rash.  Objective:   Physical Exam  Vital signs noted  Well developed well nourished male.  HEENT - Head atraumatic Normocephalic Respiratory - Lungs CTA bilateral Cardiac - RRR S1 and S2 without murmur Skin - Swelling left lower extremity is down and cellulitis is resolving.      Assessment & Plan:  Cellulitis Continue doxycycline and follow up prn Cellulitis is resolving Lysbeth Penner FNP

## 2014-04-14 ENCOUNTER — Ambulatory Visit (INDEPENDENT_AMBULATORY_CARE_PROVIDER_SITE_OTHER): Payer: Medicare Other | Admitting: Pharmacist

## 2014-04-14 DIAGNOSIS — I4891 Unspecified atrial fibrillation: Secondary | ICD-10-CM

## 2014-04-14 LAB — POCT INR: INR: 3

## 2014-04-14 NOTE — Patient Instructions (Signed)
Anticoagulation Dose Instructions as of 04/14/2014     [name] [name] Tue Wed Thu Fri Sat   New Dose 3 mg 3 mg 4.5 mg 3 mg 4.5 mg 3 mg 4.5 mg    Description       Continue 1 tablet on Sundays, mondays, wednesdays and fridays and 1 and 1/2 tablet all other days.       INR was 3.0 today

## 2014-04-17 NOTE — Addendum Note (Signed)
Addended by: Aleen Sells E on: 04/17/2014 07:46 AM   Modules accepted: Level of Service

## 2014-05-06 ENCOUNTER — Other Ambulatory Visit: Payer: Self-pay | Admitting: Family Medicine

## 2014-05-14 ENCOUNTER — Other Ambulatory Visit: Payer: Self-pay | Admitting: Family Medicine

## 2014-05-21 ENCOUNTER — Encounter: Payer: Self-pay | Admitting: Family Medicine

## 2014-05-21 ENCOUNTER — Ambulatory Visit (INDEPENDENT_AMBULATORY_CARE_PROVIDER_SITE_OTHER): Payer: Medicare Other | Admitting: Family Medicine

## 2014-05-21 VITALS — BP 118/79 | HR 109 | Temp 97.0°F | Ht 65.0 in | Wt 176.0 lb

## 2014-05-21 DIAGNOSIS — E785 Hyperlipidemia, unspecified: Secondary | ICD-10-CM

## 2014-05-21 DIAGNOSIS — R531 Weakness: Secondary | ICD-10-CM | POA: Insufficient documentation

## 2014-05-21 DIAGNOSIS — L03116 Cellulitis of left lower limb: Secondary | ICD-10-CM

## 2014-05-21 DIAGNOSIS — L03115 Cellulitis of right lower limb: Secondary | ICD-10-CM

## 2014-05-21 DIAGNOSIS — R4181 Age-related cognitive decline: Secondary | ICD-10-CM

## 2014-05-21 DIAGNOSIS — L03119 Cellulitis of unspecified part of limb: Secondary | ICD-10-CM

## 2014-05-21 DIAGNOSIS — E559 Vitamin D deficiency, unspecified: Secondary | ICD-10-CM

## 2014-05-21 DIAGNOSIS — L02419 Cutaneous abscess of limb, unspecified: Secondary | ICD-10-CM

## 2014-05-21 DIAGNOSIS — I2510 Atherosclerotic heart disease of native coronary artery without angina pectoris: Secondary | ICD-10-CM

## 2014-05-21 DIAGNOSIS — I4891 Unspecified atrial fibrillation: Secondary | ICD-10-CM

## 2014-05-21 DIAGNOSIS — R609 Edema, unspecified: Secondary | ICD-10-CM

## 2014-05-21 DIAGNOSIS — R54 Age-related physical debility: Secondary | ICD-10-CM

## 2014-05-21 LAB — POCT CBC
Granulocyte percent: 75.5 %G (ref 37–80)
HEMATOCRIT: 39.5 % — AB (ref 43.5–53.7)
Hemoglobin: 12.3 g/dL — AB (ref 14.1–18.1)
Lymph, poc: 2 (ref 0.6–3.4)
MCH, POC: 27.4 pg (ref 27–31.2)
MCHC: 31.1 g/dL — AB (ref 31.8–35.4)
MCV: 88.1 fL (ref 80–97)
MPV: 8.2 fL (ref 0–99.8)
POC Granulocyte: 8 — AB (ref 2–6.9)
POC LYMPH PERCENT: 19 %L (ref 10–50)
Platelet Count, POC: 147 10*3/uL (ref 142–424)
RBC: 4.5 M/uL — AB (ref 4.69–6.13)
RDW, POC: 14.8 %
WBC: 10.6 10*3/uL — AB (ref 4.6–10.2)

## 2014-05-21 LAB — POCT INR: INR: 2

## 2014-05-21 MED ORDER — DOXYCYCLINE HYCLATE 100 MG PO TABS: 100.0000 mg | ORAL_TABLET | Freq: Two times a day (BID) | ORAL | Status: DC

## 2014-05-21 MED ORDER — DESOXIMETASONE 0.05 % EX CREA: TOPICAL_CREAM | Freq: Two times a day (BID) | CUTANEOUS | Status: DC

## 2014-05-21 NOTE — Progress Notes (Signed)
Subjective:    Patient ID: [name] [name], male    DOB: 18-Oct-1915, 80 y.o.   MRN: [id number]  HPI Pt here for follow up and management of chronic medical problems. Patient has no particular complaints. The pharmacy is helpful with helping him get his medications done correctly. He will be due to get a protime today and lab work today we will try to get him to return in FOBT. The patient comes in today for his regular checkup. He is definitely getting more frail and debilitated. We had a long discussion regarding his need to be in an assisted living facility. And this especially becomes more appropriate since he says he is not going to renew his driver's license in November. He is more open to this suggestion today and we will implement his wishes by having the people from the assisted living facility, and talk with him. He also presents today with some edema and cellulitis in his legs he has been using some Topicort cream on days. He definitely needs more help and assistance with his care.        Patient Active Problem List   Diagnosis Date Noted  . HYPERLIPIDEMIA-MIXED 03/19/2009  . CAD, UNSPECIFIED SITE 03/19/2009  . ATRIAL FIBRILLATION 03/19/2009  . EDEMA 03/19/2009  . BENIGN PROSTATIC HYPERTROPHY, HX OF 03/19/2009   Outpatient Encounter Prescriptions as of 05/21/2014  Medication Sig  . Calcium Carbonate-Vitamin D 600-400 MG-UNIT per tablet Take 1 tablet by mouth daily.    . furosemide (LASIX) 20 MG tablet TAKE 1 TABLET DAILY  . isosorbide mononitrate (IMDUR) 30 MG 24 hr tablet TAKE 1 TABLET ONCE A DAY  . lisinopril (PRINIVIL,ZESTRIL) 10 MG tablet TAKE 1 TABLET ONCE A DAY  . metoprolol (LOPRESSOR) 50 MG tablet TAKE  (1)  TABLET TWICE A DAY.  Marland Kitchen nystatin-triamcinolone ointment (MYCOLOG) Apply 1 application topically 2 (two) times daily.  . Omega-3 Fatty Acids (FISH OIL) 1000 MG CAPS Take 3 capsules by mouth daily. 1000 mg?   . omeprazole (PRILOSEC) 20 MG capsule TAKE (1) CAPSULE  DAILY  . potassium chloride SA (K-DUR,KLOR-CON) 20 MEQ tablet TAKE 1 TABLET ONCE A DAY  . senna (SENOKOT) 8.6 MG tablet Take 1 tablet by mouth daily. prn  . simvastatin (ZOCOR) 40 MG tablet TAKE 1 TABLET IN THE EVENING FOR CHOLESTEROL  . triamcinolone cream (KENALOG) 0.1 % Apply topically 2 (two) times daily.  Marland Kitchen warfarin (COUMADIN) 3 MG tablet TAKE 1.5 TABLETS DAILY EXCEPT 1 ON MON, WED, AND FRI  . docusate sodium (COLACE) 100 MG capsule Take 100 mg by mouth as needed.    . [DISCONTINUED] Cholecalciferol (VITAMIN D) 2000 UNITS CAPS Take 1 capsule by mouth daily.  . [DISCONTINUED] Desoximetasone (TOPICORT) 0.25 % ointment Apply 1 application topically 2 (two) times daily.    Review of Systems  Constitutional: Negative.   HENT: Negative.   Eyes: Negative.   Respiratory: Negative.   Cardiovascular: Negative.   Gastrointestinal: Negative.   Endocrine: Negative.   Genitourinary: Negative.   Musculoskeletal: Negative.   Skin: Negative.   Allergic/Immunologic: Negative.   Neurological: Negative.   Hematological: Negative.   Psychiatric/Behavioral: Negative.        Objective:   Physical Exam  Nursing note and vitals reviewed. Constitutional: He is oriented to person, place, and time. He appears well-developed and well-nourished. No distress.  Increased frailty increase mild dementia, walks with a cane and has an unsteady gait  HENT:  Head: Normocephalic and atraumatic.  Right Ear: External ear  normal.  Left Ear: External ear normal.  Nose: Nose normal.  Mouth/Throat: Oropharynx is clear and moist. No oropharyngeal exudate.  Eyes: Conjunctivae and EOM are normal. Pupils are equal, round, and reactive to light. Right eye exhibits no discharge. Left eye exhibits no discharge. No scleral icterus.  Neck: Normal range of motion. Neck supple. No thyromegaly present.  Cardiovascular: Normal rate, normal heart sounds and intact distal pulses.   No murmur heard. Irregular irregular rate and  rhythm at 72 per minute  Pulmonary/Chest: Effort normal and breath sounds normal. No respiratory distress. He has no wheezes. He has no rales. He exhibits no tenderness.  Abdominal: Soft. Bowel sounds are normal. He exhibits no mass. There is no tenderness. There is no rebound and no guarding.  Musculoskeletal: Normal range of motion. He exhibits no edema and no tenderness.  Unsteady gait with walking. Difficulty with arising and sitting due to arthritic problems in extremities  Lymphadenopathy:    He has no cervical adenopathy.  Neurological: He is alert and oriented to person, place, and time. He has normal reflexes. No cranial nerve deficit.  Skin: Skin is warm and dry. Rash noted. There is erythema. No pallor.  There is a rash that is confluent on both lower extremities with bilateral edema  Psychiatric: He has a normal mood and affect. His behavior is normal. Judgment and thought content normal.  The patient seemed to understand the urgency about having his care taken care of by someone else. He is agreeable to implement this   BP 118/79  Pulse 109  Temp(Src) 97 F (36.1 C) (Oral)  Ht $R'5\' 5"'pX$  (1.651 m)  Wt 176 lb (79.833 kg)  BMI 29.29 kg/m2        Assessment & Plan:  1. Atrial fibrillation - POCT CBC  2. CAD, UNSPECIFIED SITE - POCT CBC - BMP8+EGFR - Hepatic function panel  3. HYPERLIPIDEMIA-MIXED - POCT CBC - Lipid panel  4. Vitamin D deficiency - Vit D  25 hydroxy (rtn osteoporosis monitoring)  5. A-fib - POCT INR  6. Cellulitis of leg, left  7. Cellulitis of leg, right  8. Edema  9. Frailty  Meds ordered this encounter  Medications  . doxycycline (VIBRA-TABS) 100 MG tablet    Sig: Take 1 tablet (100 mg total) by mouth 2 (two) times daily.    Dispense:  20 tablet    Refill:  0  . desoximetasone (TOPICORT) 0.05 % cream    Sig: Apply topically 2 (two) times daily.    Dispense:  30 g    Refill:  1   Patient Instructions                       Medicare  Annual Wellness Visit  [facility] and the medical providers at [facility] strive to bring you the best medical care.  In doing so we not only want to address your current medical conditions and concerns but also to detect new conditions early and prevent illness, disease and health-related problems.    Medicare offers a yearly Wellness Visit which allows our clinical staff to assess your need for preventative services including immunizations, lifestyle education, counseling to decrease risk of preventable diseases and screening for fall risk and other medical concerns.    This visit is provided free of charge (no copay) for all Medicare recipients. The clinical pharmacists at [facility] have begun to conduct these Wellness Visits which will also include a  thorough review of all your medications.    As you primary medical provider recommend that you make an appointment for your Annual Wellness Visit if you have not done so already this year.  You may set up this appointment before you leave today or you may call back ([phone]) and schedule an appointment.  Please make sure when you call that you mention that you are scheduling your Annual Wellness Visit with the clinical pharmacist so that the appointment may be made for the proper length of time.      Continue current medications. Continue good therapeutic lifestyle changes which include good diet and exercise. Fall precautions discussed with patient. If an FOBT was given today- please return it to our front desk. If you are over 24 years old - you may need Prevnar 32 or the adult Pneumonia vaccine.  Take antibiotic, doxycycline 100 twice daily for 10 days Increase Lasix to 40 mg on Monday Wednesday and Friday and 20 mg the other days until he is rechecked by the clinical pharmacist Apply topicort lightly to both legs 2 or 3 times daily Followup with the clinical pharmacists in 2 weeks to get pro  time and BMP and evaluate legs and edema We will have [facility] assisted living to come and discuss with patient about getting him a private room at their facility    Arrie Senate MD

## 2014-05-21 NOTE — Patient Instructions (Addendum)
Medicare Annual Wellness Visit  [facility] and the medical providers at [facility] strive to bring you the best medical care.  In doing so we not only want to address your current medical conditions and concerns but also to detect new conditions early and prevent illness, disease and health-related problems.    Medicare offers a yearly Wellness Visit which allows our clinical staff to assess your need for preventative services including immunizations, lifestyle education, counseling to decrease risk of preventable diseases and screening for fall risk and other medical concerns.    This visit is provided free of charge (no copay) for all Medicare recipients. The clinical pharmacists at [facility] have begun to conduct these Wellness Visits which will also include a thorough review of all your medications.    As you primary medical provider recommend that you make an appointment for your Annual Wellness Visit if you have not done so already this year.  You may set up this appointment before you leave today or you may call back ([phone]) and schedule an appointment.  Please make sure when you call that you mention that you are scheduling your Annual Wellness Visit with the clinical pharmacist so that the appointment may be made for the proper length of time.      Continue current medications. Continue good therapeutic lifestyle changes which include good diet and exercise. Fall precautions discussed with patient. If an FOBT was given today- please return it to our front desk. If you are over 30 years old - you may need Prevnar 57 or the adult Pneumonia vaccine.  Take antibiotic, doxycycline 100 twice daily for 10 days Increase Lasix to 40 mg on Monday Wednesday and Friday and 20 mg the other days until he is rechecked by the clinical pharmacist Apply topicort lightly to both legs 2 or 3 times daily Followup with the clinical  pharmacists in 2 weeks to get pro time and BMP and evaluate legs and edema We will have [facility] assisted living to come and discuss with patient about getting him a private room at their facility

## 2014-05-22 ENCOUNTER — Other Ambulatory Visit: Payer: Self-pay | Admitting: Pharmacist

## 2014-05-22 LAB — BMP8+EGFR
BUN / CREAT RATIO: 23 — AB (ref 10–22)
BUN: 22 mg/dL (ref 10–36)
CALCIUM: 8.9 mg/dL (ref 8.6–10.2)
CO2: 24 mmol/L (ref 18–29)
CREATININE: 0.97 mg/dL (ref 0.76–1.27)
Chloride: 105 mmol/L (ref 97–108)
GFR calc Af Amer: 76 mL/min/{1.73_m2} (ref >59)
GFR calc non Af Amer: 66 mL/min/{1.73_m2} (ref >59)
Glucose: 96 mg/dL (ref 65–99)
Potassium: 4.8 mmol/L (ref 3.5–5.2)
Sodium: 142 mmol/L (ref 134–144)

## 2014-05-22 LAB — LIPID PANEL
CHOL/HDL RATIO: 2.7 ratio (ref 0.0–5.0)
Cholesterol, Total: 126 mg/dL (ref 100–199)
HDL: 46 mg/dL (ref >39)
LDL Calculated: 55 mg/dL (ref 0–99)
Triglycerides: 124 mg/dL (ref 0–149)
VLDL CHOLESTEROL CAL: 25 mg/dL (ref 5–40)

## 2014-05-22 LAB — HEPATIC FUNCTION PANEL
ALBUMIN: 3.9 g/dL (ref 3.2–4.6)
ALT: 9 IU/L (ref 0–44)
AST: 15 IU/L (ref 0–40)
Alkaline Phosphatase: 62 IU/L (ref 39–117)
BILIRUBIN DIRECT: 0.14 mg/dL (ref 0.00–0.40)
BILIRUBIN TOTAL: 0.5 mg/dL (ref 0.0–1.2)
Total Protein: 6 g/dL (ref 6.0–8.5)

## 2014-05-22 LAB — VITAMIN D 25 HYDROXY (VIT D DEFICIENCY, FRACTURES): VIT D 25 HYDROXY: 36.7 ng/mL (ref 30.0–100.0)

## 2014-05-22 MED ORDER — FUROSEMIDE 20 MG PO TABS: ORAL_TABLET | ORAL | Status: DC

## 2014-05-28 ENCOUNTER — Telehealth: Payer: Self-pay | Admitting: *Deleted

## 2014-05-28 NOTE — Telephone Encounter (Signed)
Message copied by Shelbie Ammons on Wed May 28, 2014  9:50 AM ------      Message from: Chipper Herb      Created: Thu May 22, 2014  7:45 AM       The blood sugar, kidney function tests and electrolytes are within normal limits. The most important test the creatinine is also within normal limits      The liver function tests are within normal limits      Cholesterol numbers are excellent and at goal on traditional lipid testing      The vitamin D level is within normal limits, continue current treatment ------

## 2014-05-28 NOTE — Telephone Encounter (Signed)
Please return our call.  (All labs are normal).

## 2014-06-02 ENCOUNTER — Ambulatory Visit (INDEPENDENT_AMBULATORY_CARE_PROVIDER_SITE_OTHER): Payer: Medicare Other | Admitting: Pharmacist

## 2014-06-02 DIAGNOSIS — Z79899 Other long term (current) drug therapy: Secondary | ICD-10-CM

## 2014-06-02 DIAGNOSIS — I4891 Unspecified atrial fibrillation: Secondary | ICD-10-CM

## 2014-06-02 LAB — POCT INR: INR: 2.6

## 2014-06-02 NOTE — Patient Instructions (Signed)
Anticoagulation Dose Instructions as of 06/02/2014     Dorene Grebe Tue Wed Thu Fri Sat   New Dose 3 mg 3 mg 4.5 mg 3 mg 4.5 mg 3 mg 4.5 mg    Description       Continue 1 tablet on Sundays, mondays, wednesdays and fridays and 1 and 1/2 tablet all other days.       INR was 2.6 today

## 2014-06-02 NOTE — Progress Notes (Signed)
HPI:  Per Dr Tawanna Sat request checked edema today and is much improved from 2 weeks ago.  1+ bilaterally and cellulitis is improved.  Weight is down 1 and 1/2 pounds.   Assessment - improved edmea  Plan  Continue  Furosemide 20mg  2 tablets MWF and 1 tablet all other days.  Recheck BMP today - pending Recheck edema and PT/INR in 1 month

## 2014-06-03 LAB — BMP8+EGFR
BUN / CREAT RATIO: 23 — AB (ref 10–22)
BUN: 22 mg/dL (ref 10–36)
CO2: 25 mmol/L (ref 18–29)
Calcium: 9.5 mg/dL (ref 8.6–10.2)
Chloride: 100 mmol/L (ref 97–108)
Creatinine, Ser: 0.97 mg/dL (ref 0.76–1.27)
GFR calc non Af Amer: 66 mL/min/{1.73_m2} (ref >59)
GFR, EST AFRICAN AMERICAN: 76 mL/min/{1.73_m2} (ref >59)
Glucose: 100 mg/dL — ABNORMAL HIGH (ref 65–99)
POTASSIUM: 4.9 mmol/L (ref 3.5–5.2)
SODIUM: 141 mmol/L (ref 134–144)

## 2014-06-06 NOTE — Telephone Encounter (Signed)
No response

## 2014-06-10 ENCOUNTER — Other Ambulatory Visit: Payer: Self-pay | Admitting: Family Medicine

## 2014-06-30 ENCOUNTER — Ambulatory Visit (INDEPENDENT_AMBULATORY_CARE_PROVIDER_SITE_OTHER): Payer: Medicare Other | Admitting: Pharmacist

## 2014-06-30 DIAGNOSIS — R609 Edema, unspecified: Secondary | ICD-10-CM

## 2014-06-30 DIAGNOSIS — L03116 Cellulitis of left lower limb: Secondary | ICD-10-CM

## 2014-06-30 DIAGNOSIS — I4891 Unspecified atrial fibrillation: Secondary | ICD-10-CM

## 2014-06-30 DIAGNOSIS — L02419 Cutaneous abscess of limb, unspecified: Secondary | ICD-10-CM

## 2014-06-30 DIAGNOSIS — R600 Localized edema: Secondary | ICD-10-CM

## 2014-06-30 DIAGNOSIS — L03115 Cellulitis of right lower limb: Secondary | ICD-10-CM

## 2014-06-30 DIAGNOSIS — L03119 Cellulitis of unspecified part of limb: Secondary | ICD-10-CM

## 2014-06-30 LAB — POCT INR: INR: 2.2

## 2014-06-30 MED ORDER — FUROSEMIDE 40 MG PO TABS: 40.0000 mg | ORAL_TABLET | Freq: Every day | ORAL | Status: DC

## 2014-06-30 MED ORDER — DOXYCYCLINE HYCLATE 100 MG PO TABS: 100.0000 mg | ORAL_TABLET | Freq: Two times a day (BID) | ORAL | Status: DC

## 2014-06-30 NOTE — Progress Notes (Signed)
CC:  Edema in lower extremeties   Patient presents today to recheck INR.  He is found to have LEE bialterally 1+. Also LE are red and warm to the touch.  Discoloration extends up to mid calf.    Patient observed and evaluated by Dr Redge Gainer, MD  Assessment: Edema Cellulitis  Plan: Doxycycline 100mg  1 tablet bid for 10 days Increase furosemide to 40mg  daily.  Follow up protime and edema in 1 week  Cherre Robins, PharmD, CPP

## 2014-07-08 ENCOUNTER — Ambulatory Visit (INDEPENDENT_AMBULATORY_CARE_PROVIDER_SITE_OTHER): Payer: Medicare Other | Admitting: Pharmacist

## 2014-07-08 ENCOUNTER — Encounter: Payer: Self-pay | Admitting: Pharmacist

## 2014-07-08 DIAGNOSIS — L03115 Cellulitis of right lower limb: Secondary | ICD-10-CM

## 2014-07-08 DIAGNOSIS — R609 Edema, unspecified: Secondary | ICD-10-CM

## 2014-07-08 DIAGNOSIS — L03116 Cellulitis of left lower limb: Secondary | ICD-10-CM

## 2014-07-08 DIAGNOSIS — I4891 Unspecified atrial fibrillation: Secondary | ICD-10-CM

## 2014-07-08 DIAGNOSIS — I480 Paroxysmal atrial fibrillation: Secondary | ICD-10-CM

## 2014-07-08 DIAGNOSIS — R600 Localized edema: Secondary | ICD-10-CM

## 2014-07-08 DIAGNOSIS — L03119 Cellulitis of unspecified part of limb: Secondary | ICD-10-CM

## 2014-07-08 DIAGNOSIS — L02419 Cutaneous abscess of limb, unspecified: Secondary | ICD-10-CM

## 2014-07-08 LAB — POCT INR: INR: 2.4

## 2014-07-08 MED ORDER — DOXYCYCLINE HYCLATE 100 MG PO TABS: 100.0000 mg | ORAL_TABLET | Freq: Every day | ORAL | Status: DC

## 2014-07-08 NOTE — Patient Instructions (Addendum)
Anticoagulation Dose Instructions as of 07/08/2014     Dorene Grebe Tue Wed Thu Fri Sat   New Dose 3 mg 3 mg 4.5 mg 3 mg 4.5 mg 3 mg 4.5 mg    Description       Continue 1 tablet on Sundays, mondays, wednesdays and fridays and 1 and 1/2 tablet all other days.      INR was 2.4 today

## 2014-07-08 NOTE — Progress Notes (Signed)
Dr Laurance Flatten evaluated cellulitis today Redness still present on both lower extremeties but is less compared to 1 week ago.  Edema is trace to 1+ - improved.   Assessment  Cellulitis bilaterally lower extremities  Plan  Doxycycline 100mg  daily for 10 more days.  Desoximetasone cream applied to legs once or twice a day (patient has at home) RTC in 2 week.

## 2014-07-09 LAB — BMP8+EGFR
BUN/Creatinine Ratio: 22 (ref 10–22)
BUN: 23 mg/dL (ref 10–36)
CHLORIDE: 102 mmol/L (ref 97–108)
CO2: 23 mmol/L (ref 18–29)
CREATININE: 1.06 mg/dL (ref 0.76–1.27)
Calcium: 9 mg/dL (ref 8.6–10.2)
GFR calc non Af Amer: 59 mL/min/{1.73_m2} — ABNORMAL LOW (ref >59)
GFR, EST AFRICAN AMERICAN: 68 mL/min/{1.73_m2} (ref >59)
GLUCOSE: 86 mg/dL (ref 65–99)
Potassium: 4.5 mmol/L (ref 3.5–5.2)
Sodium: 144 mmol/L (ref 134–144)

## 2014-07-23 ENCOUNTER — Encounter: Payer: Self-pay | Admitting: Pharmacist

## 2014-07-23 ENCOUNTER — Ambulatory Visit (INDEPENDENT_AMBULATORY_CARE_PROVIDER_SITE_OTHER): Payer: Medicare Other | Admitting: Pharmacist

## 2014-07-23 DIAGNOSIS — L03119 Cellulitis of unspecified part of limb: Secondary | ICD-10-CM

## 2014-07-23 DIAGNOSIS — I4891 Unspecified atrial fibrillation: Secondary | ICD-10-CM

## 2014-07-23 DIAGNOSIS — L03116 Cellulitis of left lower limb: Secondary | ICD-10-CM

## 2014-07-23 DIAGNOSIS — L03115 Cellulitis of right lower limb: Secondary | ICD-10-CM

## 2014-07-23 DIAGNOSIS — I480 Paroxysmal atrial fibrillation: Secondary | ICD-10-CM

## 2014-07-23 DIAGNOSIS — L02419 Cutaneous abscess of limb, unspecified: Secondary | ICD-10-CM

## 2014-07-23 LAB — POCT INR: INR: 2.7

## 2014-07-23 MED ORDER — TRIAMCINOLONE ACETONIDE 0.1 % EX CREA: TOPICAL_CREAM | CUTANEOUS | Status: DC

## 2014-07-23 MED ORDER — FUROSEMIDE 20 MG PO TABS: ORAL_TABLET | ORAL | Status: DC

## 2014-07-23 MED ORDER — SULFAMETHOXAZOLE-TMP DS 800-160 MG PO TABS: 1.0000 | ORAL_TABLET | Freq: Two times a day (BID) | ORAL | Status: DC

## 2014-07-23 NOTE — Progress Notes (Signed)
INR was checked today - see anticoagulation note. Dr Laurance Flatten evaluated cellulitis today  Redness still present on both lower extremities, increased from last visit 07/08/14 despite doxycycline for 1 week Edema is trace to 1+ - no change.  Assessment  Cellulitis bilaterally lower extremities Possible contact dermititis  Plan  Septra DS 1 tablet bid for 7 days Decreased warfarin while on septra to 1 tablet daily except 1/2 on mondays and fridays Eucerine + TCA 1% in a 1:1 mixture - apply to legs BID Avoid the following soaps - [place], Lever 2000 and [place].  Instead use [place] or [employer] free laundry detergents and fabric softners (All is a good brand to use)   RTC in 1 week.   DWM / TBE

## 2014-07-23 NOTE — Patient Instructions (Signed)
Avoid the following soaps - [place], Lever 2000 and [place].  Instead use [place] or [employer] free laundry detergents and fabric softners (All is a good brand to use)  Use eucerin+triamcinolone cream - apply to legs twice a day    Contact Dermatitis Contact dermatitis is a reaction to certain substances that touch the skin. Contact dermatitis can be either irritant contact dermatitis or allergic contact dermatitis. Irritant contact dermatitis does not require previous exposure to the substance for a reaction to occur.Allergic contact dermatitis only occurs if you have been exposed to the substance before. Upon a repeat exposure, your body reacts to the substance.  CAUSES  Many substances can cause contact dermatitis. Irritant dermatitis is most commonly caused by repeated exposure to mildly irritating substances, such as:  Makeup.  Soaps.  Detergents.  Bleaches.  Acids.  Metal salts, such as nickel. Allergic contact dermatitis is most commonly caused by exposure to:  Poisonous plants.  Chemicals (deodorants, shampoos).  Jewelry.  Latex.  Neomycin in triple antibiotic cream.  Preservatives in products, including clothing. SYMPTOMS  The area of skin that is exposed may develop:  Dryness or flaking.  Redness.  Cracks.  Itching.  Pain or a burning sensation.  Blisters. With allergic contact dermatitis, there may also be swelling in areas such as the eyelids, mouth, or genitals.  DIAGNOSIS  Your caregiver can usually tell what the problem is by doing a physical exam. In cases where the cause is uncertain and an allergic contact dermatitis is suspected, a patch skin test may be performed to help determine the cause of your dermatitis. TREATMENT Treatment includes protecting the skin from further contact with the irritating substance by avoiding that substance if possible. Barrier creams, powders, and gloves may be helpful. Your caregiver may also  recommend:  Steroid creams or ointments applied 2 times daily. For best results, soak the rash area in cool water for 20 minutes. Then apply the medicine. Cover the area with a plastic wrap. You can store the steroid cream in the refrigerator for a "chilly" effect on your rash. That may decrease itching. Oral steroid medicines may be needed in more severe cases.  Antibiotics or antibacterial ointments if a skin infection is present.  Antihistamine lotion or an antihistamine taken by mouth to ease itching.  Lubricants to keep moisture in your skin.  Burow's solution to reduce redness and soreness or to dry a weeping rash. Mix one packet or tablet of solution in 2 cups cool water. Dip a clean washcloth in the mixture, wring it out a bit, and put it on the affected area. Leave the cloth in place for 30 minutes. Do this as often as possible throughout the day.  Taking several cornstarch or baking soda baths daily if the area is too large to cover with a washcloth. Harsh chemicals, such as alkalis or acids, can cause skin damage that is like a burn. You should flush your skin for 15 to 20 minutes with cold water after such an exposure. You should also seek immediate medical care after exposure. Bandages (dressings), antibiotics, and pain medicine may be needed for severely irritated skin.  HOME CARE INSTRUCTIONS  Avoid the substance that caused your reaction.  Keep the area of skin that is affected away from hot water, soap, sunlight, chemicals, acidic substances, or anything else that would irritate your skin.  Do not scratch the rash. Scratching may cause the rash to become infected.  You may take cool  baths to help stop the itching.  Only take over-the-counter or prescription medicines as directed by your caregiver.  See your caregiver for follow-up care as directed to make sure your skin is healing properly. SEEK MEDICAL CARE IF:   Your condition is not better after 3 days of  treatment.  You seem to be getting worse.  You see signs of infection such as swelling, tenderness, redness, soreness, or warmth in the affected area.  You have any problems related to your medicines. Document Released: 12/02/2000 Document Revised: 02/27/2012 Document Reviewed: 05/10/2011 [facility] Patient Information 2015 [place], [place]. This information is not intended to replace advice given to you by your health care provider. Make sure you discuss any questions you have with your health care provider.

## 2014-07-30 ENCOUNTER — Ambulatory Visit (INDEPENDENT_AMBULATORY_CARE_PROVIDER_SITE_OTHER): Payer: Medicare Other | Admitting: Pharmacist

## 2014-07-30 DIAGNOSIS — I4891 Unspecified atrial fibrillation: Secondary | ICD-10-CM

## 2014-07-30 DIAGNOSIS — R609 Edema, unspecified: Secondary | ICD-10-CM

## 2014-07-30 LAB — POCT INR: INR: 2

## 2014-07-30 MED ORDER — SULFAMETHOXAZOLE-TMP DS 800-160 MG PO TABS: 1.0000 | ORAL_TABLET | Freq: Two times a day (BID) | ORAL | Status: DC

## 2014-07-30 NOTE — Progress Notes (Signed)
Patient's cellulitis much improved but still present.  Edema 1+ bilaterally LEE - improved.   Recommend continue septra BID for 7 more days.  Continue furosemide at current dose.  Continue lower warfarin dose while on Septra and then restart regular dose - [place] aware.  BMET checked today - pending.   RTC 08/08/14

## 2014-07-30 NOTE — Patient Instructions (Signed)
Anticoagulation Dose Instructions as of 07/30/2014     Dorene Grebe Tue Wed Thu Fri Sat   New Dose 3 mg 1.5 mg 3 mg 3 mg 3 mg 1.5 mg 3 mg    Description       Continue septra for cellulitis for 7 more day, continue current warfarin doseso will decrease dose during treatment.      INR was 2.0 today

## 2014-07-31 LAB — BMP8+EGFR
BUN / CREAT RATIO: 17 (ref 10–22)
BUN: 29 mg/dL (ref 10–36)
CO2: 27 mmol/L (ref 18–29)
CREATININE: 1.66 mg/dL — AB (ref 0.76–1.27)
Calcium: 9.1 mg/dL (ref 8.6–10.2)
Chloride: 95 mmol/L — ABNORMAL LOW (ref 97–108)
GFR, EST AFRICAN AMERICAN: 40 mL/min/{1.73_m2} — AB (ref >59)
GFR, EST NON AFRICAN AMERICAN: 34 mL/min/{1.73_m2} — AB (ref >59)
Glucose: 89 mg/dL (ref 65–99)
Potassium: 5.6 mmol/L — ABNORMAL HIGH (ref 3.5–5.2)
Sodium: 136 mmol/L (ref 134–144)

## 2014-08-08 ENCOUNTER — Ambulatory Visit (INDEPENDENT_AMBULATORY_CARE_PROVIDER_SITE_OTHER): Payer: Medicare Other | Admitting: Pharmacist

## 2014-08-08 ENCOUNTER — Encounter: Payer: Self-pay | Admitting: Pharmacist

## 2014-08-08 ENCOUNTER — Other Ambulatory Visit: Payer: Self-pay | Admitting: Family Medicine

## 2014-08-08 VITALS — BP 90/60 | HR 68 | Ht 65.0 in | Wt 166.0 lb

## 2014-08-08 DIAGNOSIS — I4891 Unspecified atrial fibrillation: Secondary | ICD-10-CM

## 2014-08-08 DIAGNOSIS — R7989 Other specified abnormal findings of blood chemistry: Secondary | ICD-10-CM

## 2014-08-08 DIAGNOSIS — Z9181 History of falling: Secondary | ICD-10-CM | POA: Insufficient documentation

## 2014-08-08 DIAGNOSIS — Z0000 Encounter for general adult medical examination without abnormal findings: Secondary | ICD-10-CM

## 2014-08-08 LAB — POCT INR: INR: 2.4

## 2014-08-08 MED ORDER — ISOSORBIDE MONONITRATE 10 MG PO TABS: 10.0000 mg | ORAL_TABLET | Freq: Two times a day (BID) | ORAL | Status: DC

## 2014-08-08 MED ORDER — ISOSORBIDE DINITRATE 10 MG PO TABS: 10.0000 mg | ORAL_TABLET | Freq: Three times a day (TID) | ORAL | Status: DC

## 2014-08-08 NOTE — Progress Notes (Signed)
Patient ID: [name] [name], male   DOB: 10-04-1915, 80 y.o.   MRN: [id number] Subjective:    [name] [name] is a 80 y.o. male who presents for Medicare Initial Wellness Visit and recheck Protime / cellulitis   Preventive Screening-Counseling & Management  Tobacco History  Smoking status  . Never Smoker   Smokeless tobacco  . Never Used    Comment: tobacco use - no    Current Problems (verified) Patient Active Problem List   Diagnosis Date Noted  . Frailty 05/21/2014  . HYPERLIPIDEMIA-MIXED 03/19/2009  . CAD, UNSPECIFIED SITE 03/19/2009  . ATRIAL FIBRILLATION 03/19/2009  . EDEMA 03/19/2009  . BENIGN PROSTATIC HYPERTROPHY, HX OF 03/19/2009    Medications Prior to Visit Current Outpatient Prescriptions on File Prior to Visit  Medication Sig Dispense Refill  . Calcium Carbonate-Vitamin D 600-400 MG-UNIT per tablet Take 1 tablet by mouth daily.        Marland Kitchen desoximetasone (TOPICORT) 0.05 % cream Apply topically 2 (two) times daily.  30 g  1  . docusate sodium (COLACE) 100 MG capsule Take 100 mg by mouth as needed.        . furosemide (LASIX) 40 MG tablet Take 1 tablet (40 mg total) by mouth daily.  30 tablet  1  . lisinopril (PRINIVIL,ZESTRIL) 10 MG tablet TAKE 1 TABLET ONCE A DAY  90 tablet  1  . metoprolol (LOPRESSOR) 50 MG tablet TAKE  (1)  TABLET TWICE A DAY.  180 tablet  1  . nystatin-triamcinolone ointment (MYCOLOG) Apply 1 application topically 2 (two) times daily.  30 g  0  . Omega-3 Fatty Acids (FISH OIL) 1000 MG CAPS Take 3 capsules by mouth daily. 1000 mg?       . omeprazole (PRILOSEC) 20 MG capsule TAKE (1) CAPSULE DAILY  30 capsule  5  . potassium chloride SA (K-DUR,KLOR-CON) 20 MEQ tablet TAKE 1 TABLET ONCE A DAY  90 tablet  1  . senna (SENOKOT) 8.6 MG tablet Take 1 tablet by mouth daily. prn      . simvastatin (ZOCOR) 40 MG tablet TAKE 1 TABLET IN THE EVENING FOR CHOLESTEROL  90 tablet  1  . sulfamethoxazole-trimethoprim (BACTRIM DS) 800-160 MG per tablet Take 1  tablet by mouth 2 (two) times daily.  14 tablet  0  . triamcinolone cream (KENALOG) 0.1 % Pharmacy to mix 120g of trimacinolone 1% with 120g of eucerin Apply to legs BID  240 g  0  . warfarin (COUMADIN) 3 MG tablet TAKE 1.5 TABLETS DAILY EXCEPT 1 ON MON, WED, AND FRI  45 tablet  2   No current facility-administered medications on file prior to visit.    Current Medications (verified) Current Outpatient Prescriptions  Medication Sig Dispense Refill  . Calcium Carbonate-Vitamin D 600-400 MG-UNIT per tablet Take 1 tablet by mouth daily.        Marland Kitchen desoximetasone (TOPICORT) 0.05 % cream Apply topically 2 (two) times daily.  30 g  1  . docusate sodium (COLACE) 100 MG capsule Take 100 mg by mouth as needed.        . furosemide (LASIX) 40 MG tablet Take 1 tablet (40 mg total) by mouth daily.  30 tablet  1  . isosorbide dinitrate (ISORDIL) 10 MG tablet Take 1 tablet (10 mg total) by mouth 3 (three) times daily.  30 tablet  2  . lisinopril (PRINIVIL,ZESTRIL) 10 MG tablet TAKE 1 TABLET ONCE A DAY  90 tablet  1  . metoprolol (LOPRESSOR) 50  MG tablet TAKE  (1)  TABLET TWICE A DAY.  180 tablet  1  . nystatin-triamcinolone ointment (MYCOLOG) Apply 1 application topically 2 (two) times daily.  30 g  0  . Omega-3 Fatty Acids (FISH OIL) 1000 MG CAPS Take 3 capsules by mouth daily. 1000 mg?       . omeprazole (PRILOSEC) 20 MG capsule TAKE (1) CAPSULE DAILY  30 capsule  5  . potassium chloride SA (K-DUR,KLOR-CON) 20 MEQ tablet TAKE 1 TABLET ONCE A DAY  90 tablet  1  . senna (SENOKOT) 8.6 MG tablet Take 1 tablet by mouth daily. prn      . simvastatin (ZOCOR) 40 MG tablet TAKE 1 TABLET IN THE EVENING FOR CHOLESTEROL  90 tablet  1  . sulfamethoxazole-trimethoprim (BACTRIM DS) 800-160 MG per tablet Take 1 tablet by mouth 2 (two) times daily.  14 tablet  0  . triamcinolone cream (KENALOG) 0.1 % Pharmacy to mix 120g of trimacinolone 1% with 120g of eucerin Apply to legs BID  240 g  0  . warfarin (COUMADIN) 3 MG  tablet TAKE 1.5 TABLETS DAILY EXCEPT 1 ON MON, WED, AND FRI  45 tablet  2   No current facility-administered medications for this visit.     Allergies (verified) Penicillins   PAST HISTORY  Family History Family History  Problem Relation Age of Onset  . Cancer      fhx  . Coronary artery disease      fhx  . Diabetes      fhx; also of alcoholism   . Hip fracture Mother   . Heart attack Father     Social History History  Substance Use Topics  . Smoking status: Never Smoker   . Smokeless tobacco: Never Used     Comment: tobacco use - no   . Alcohol Use: No    Are there smokers in your home (other than you)?  No  Risk Factors Current exercise habits: Exercise is limited by orthopedic condition(s): unsteady gait.  Dietary issues discussed: none   Cardiac risk factors: advanced age (older than 61 for men, 77 for women), family history of premature cardiovascular disease, hypertension, male gender and sedentary lifestyle.  Depression Screen (Note: if answer to either of the following is "Yes", a more complete depression screening is indicated)   Q1: Over the past two weeks, have you felt down, depressed or hopeless? No  Q2: Over the past two weeks, have you felt little interest or pleasure in doing things? No  Have you lost interest or pleasure in daily life? No  Do you often feel hopeless? No  Do you cry easily over simple problems? No  Activities of Daily Living In your present state of health, do you have any difficulty performing the following activities?:  Driving? No Managing money?  No Feeding yourself? No Getting from bed to chair? No Climbing a flight of stairs? Yes Preparing food and eating?: No Bathing or showering? No Getting dressed: No Getting to the toilet? No Using the toilet:No Moving around from place to place: Yes In the past year have you fallen or had a near fall?:Yes - patient initially denied any falls within in the last year but then I  notice a bruise on his left elbow and he states that he fell out of bed a few days ago.     Are you sexually active?  No  Do you have more than one partner?  No  Hearing Difficulties: Yes Do  you often ask people to speak up or repeat themselves? Yes Do you experience ringing or noises in your ears? No Do you have difficulty understanding soft or whispered voices? Yes   Do you feel that you have a problem with memory? Yes  Do you often misplace items? Yes  Do you feel safe at home?  Yes  Cognitive Testing  Alert? Yes  Normal Appearance?Yes  Oriented to person? Yes  Place? Yes   Time? No  Recall of three objects?  No  Can perform simple calculations? Yes  Displays appropriate judgment?Yes  Can read the correct time from a watch face?Yes   Advanced Directives have been discussed with the patient? Yes   List the Names of Other Physician/Practitioners you currently use: 1.  Cardiology - Dr Jenkins [place] 2.  Optometrist - Dr Darrel Reach any recent Medical Services you may have received from other than [facility] providers in the past year (date may be approximate).  Immunization History  Administered Date(s) Administered  . Influenza Whole 11/20/2012  . Influenza,inj,Quad PF,36+ Mos 09/12/2013  . Pneumococcal Conjugate-13 01/20/2014  . Pneumococcal Polysaccharide-23 10/19/1997  . Td 12/19/2002  . Zoster 09/19/2007    Screening Tests Health Maintenance  Topic Date Due  . Influenza Vaccine  07/19/2014  . Tetanus/tdap  08/21/2014 (Originally 12/19/2012)  . Colonoscopy  01/21/2016 (Originally 05/19/2010)  . Pneumococcal Polysaccharide Vaccine Age 58 And Over  Completed  . Zostavax  Completed   Probably due eye exam - patient cannot remember last appt but this was in last 1-2 years.  All answers were reviewed with the patient and necessary referrals were made:  Cherre Robins, [facility]   08/08/2014   History reviewed: allergies, current medications, past family history, past medical  history, past social history, past surgical history and problem list  Examined both legs - cellulitis continues to improve - patient is off ABX   Objective:  Blood pressure 90/60, pulse 68, height _0  (1.651 m), weight 166 lb (75.297 kg). Body mass index is 27.62 kg/(m^2). INR = 2.4    Assessment:     Initial Medicare Wellness Visit Therapeutic anticoagulation Cellulitis - improved Elevated serum creatinine - rechecking today, furosemide has been decreased High Fall Risk Hypotension     Plan:     During the course of the visit the patient was educated and counseled about appropriate screening and preventive services including:    Pneumococcal vaccine - UTD   Influenza vaccine- UTD  Hepatitis B vaccine - Declined  Td vaccine - Cost was $45 - patient to postpone  Screening electrocardiogram - yearly check  Prostate cancer screening - no longer indicated  Colorectal cancer screening - colonoscopy no longer indicated but gets yearly FOBT  Diabetes screening - continue to assess  Glaucoma screening - requested notes from Dr Iona Hansen office (they are closed until 08/11/14 but LM with fax number to send report of last visit to our office)  Advanced directives: has NO advanced directive - not interested in additional information  Decrease furosemide back to 40 mg daily - RTC in 10 days to recheck edema and cellulitis  Decrease isosorbide to 98m daily Anticoagulation Dose Instructions as of 08/08/2014     SDorene GrebeTue Wed Thu Fri Sat   New Dose 3 mg 3 mg 4.5 mg 3 mg 4.5 mg 3 mg 4.5 mg    Description       Return to lower warfarin dose since off antibiotics.  Take warfarin 347m1 tablet daily  except 4.31m tuesdays, thursdays and saturdays.     Orders Placed This Encounter  Procedures  . BMP8+EGFR    Order Specific Question:  Has the patient fasted?    Answer:  No    RTC in 10 days to recheck legs and PT/INR   Patient Instructions (the written plan) was given to  the patient.  Medicare Attestation I have personally reviewed: The patient's medical and social history Their use of alcohol, tobacco or illicit drugs Their current medications and supplements The patient's functional ability including ADLs,fall risks, home safety risks, cognitive, and hearing and visual impairment Diet and physical activities Evidence for depression or mood disorders  The patient's weight, height, BMI, and HR/BP have been recorded in the chart.  I have made referrals, counseling, and provided education to the patient based on review of the above and I have provided the patient with a written personalized care plan for preventive services.     [facility] Robins [facility]  08/08/2014

## 2014-08-08 NOTE — Patient Instructions (Signed)
Anticoagulation Dose Instructions as of 08/08/2014     [name] [name] Tue Wed Thu Fri Sat   New Dose 3 mg 3 mg 4.5 mg 3 mg 4.5 mg 3 mg 4.5 mg    Description       Return to lower warfarin dose since off antibiotics.  Take warfarin $RemoveBefor'3mg'[id number]$  1 tablet daily except 4.$RemoveBefore'5mg'[id number]$  tuesdays, thursdays and saturdays.       Preventive Care for Adults A healthy lifestyle and preventive care can promote health and wellness. Preventive health guidelines for men include the following key practices:  A routine yearly physical is a good way to check with your health care provider about your health and preventative screening. It is a chance to share any concerns and updates on your health and to receive a thorough exam.  Visit your dentist for a routine exam and preventative care every 6 months. Brush your teeth twice a day and floss once a day. Good oral hygiene prevents tooth decay and gum disease.  The frequency of eye exams is based on your age, health, family medical history, use of contact lenses, and other factors. Follow your health care provider's recommendations for frequency of eye exams.  Eat a healthy diet. Foods such as vegetables, fruits, whole grains, low-fat dairy products, and lean protein foods contain the nutrients you need without too many calories. Decrease your intake of foods high in solid fats, added sugars, and salt. Eat the right amount of calories for you.Get information about a proper diet from your health care provider, if necessary.  Regular physical exercise is one of the most important things you can do for your health. Most adults should get at least 150 minutes of moderate-intensity exercise (any activity that increases your heart rate and causes you to sweat) each week. In addition, most adults need muscle-strengthening exercises on 2 or more days a week.  Maintain a healthy weight. The body mass index (BMI) is a screening tool to identify possible weight problems. It provides an estimate of  body fat based on height and weight. Your health care provider can find your BMI and can help you achieve or maintain a healthy weight.For adults 20 years and older:  A BMI below 18.5 is considered underweight.  A BMI of 18.5 to 24.9 is normal.  A BMI of 25 to 29.9 is considered overweight.  A BMI of 30 and above is considered obese.  Maintain normal blood lipids and cholesterol levels by exercising and minimizing your intake of saturated fat. Eat a balanced diet with plenty of fruit and vegetables. Blood tests for lipids and cholesterol should begin at age 59 and be repeated every 5 years. If your lipid or cholesterol levels are high, you are over 50, or you are at high risk for heart disease, you may need your cholesterol levels checked more frequently.Ongoing high lipid and cholesterol levels should be treated with medicines if diet and exercise are not working.  If you smoke, find out from your health care provider how to quit. If you do not use tobacco, do not start.  Lung cancer screening is recommended for adults aged 33-80 years who are at high risk for developing lung cancer because of a history of smoking. A yearly low-dose CT scan of the lungs is recommended for people who have at least a 30-pack-year history of smoking and are a current smoker or have quit within the past 15 years. A pack year of smoking is smoking an average of 1 pack  of cigarettes a day for 1 year (for example: 1 pack a day for 30 years or 2 packs a day for 15 years). Yearly screening should continue until the smoker has stopped smoking for at least 15 years. Yearly screening should be stopped for people who develop a health problem that would prevent them from having lung cancer treatment.  If you choose to drink alcohol, do not have more than 2 drinks per day. One drink is considered to be 12 ounces (355 mL) of beer, 5 ounces (148 mL) of wine, or 1.5 ounces (44 mL) of liquor.  Avoid use of street drugs. Do not  share needles with anyone. Ask for help if you need support or instructions about stopping the use of drugs.  High blood pressure causes heart disease and increases the risk of stroke. Your blood pressure should be checked at least every 1-2 years. Ongoing high blood pressure should be treated with medicines, if weight loss and exercise are not effective.  If you are 36-108 years old, ask your health care provider if you should take aspirin to prevent heart disease.  Diabetes screening involves taking a blood sample to check your fasting blood sugar level. This should be done once every 3 years, after age 73, if you are within normal weight and without risk factors for diabetes. Testing should be considered at a younger age or be carried out more frequently if you are overweight and have at least 1 risk factor for diabetes.  Colorectal cancer can be detected and often prevented. Most routine colorectal cancer screening begins at the age of 80 and continues through age 22. However, your health care provider may recommend screening at an earlier age if you have risk factors for colon cancer. On a yearly basis, your health care provider may provide home test kits to check for hidden blood in the stool. Use of a small camera at the end of a tube to directly examine the colon (sigmoidoscopy or colonoscopy) can detect the earliest forms of colorectal cancer. Talk to your health care provider about this at age 15, when routine screening begins. Direct exam of the colon should be repeated every 5-10 years through age 19, unless early forms of precancerous polyps or small growths are found.  People who are at an increased risk for hepatitis B should be screened for this virus. You are considered at high risk for hepatitis B if:  You were born in a country where hepatitis B occurs often. Talk with your health care provider about which countries are considered high risk.  Your parents were born in a high-risk  country and you have not received a shot to protect against hepatitis B (hepatitis B vaccine).  You have HIV or AIDS.  You use needles to inject street drugs.  You live with, or have sex with, someone who has hepatitis B.  You are a man who has sex with other men (MSM).  You get hemodialysis treatment.  You take certain medicines for conditions such as cancer, organ transplantation, and autoimmune conditions.  Hepatitis C blood testing is recommended for all people born from [birth year] through 1965 and any individual with known risks for hepatitis C.  Practice safe sex. Use condoms and avoid high-risk sexual practices to reduce the spread of sexually transmitted infections (STIs). STIs include gonorrhea, chlamydia, syphilis, trichomonas, herpes, HPV, and human immunodeficiency virus (HIV). Herpes, HIV, and HPV are viral illnesses that have no cure. They can result in disability, cancer, and  death.  If you are at risk of being infected with HIV, it is recommended that you take a prescription medicine daily to prevent HIV infection. This is called preexposure prophylaxis (PrEP). You are considered at risk if:  You are a man who has sex with other men (MSM) and have other risk factors.  You are a heterosexual man, are sexually active, and are at increased risk for HIV infection.  You take drugs by injection.  You are sexually active with a partner who has HIV.  Talk with your health care provider about whether you are at high risk of being infected with HIV. If you choose to begin PrEP, you should first be tested for HIV. You should then be tested every 3 months for as long as you are taking PrEP.  A one-time screening for abdominal aortic aneurysm (AAA) and surgical repair of large AAAs by ultrasound are recommended for men ages 65 to 10 years who are current or former smokers.  Healthy men should no longer receive prostate-specific antigen (PSA) blood tests as part of routine cancer  screening. Talk with your health care provider about prostate cancer screening.  Testicular cancer screening is not recommended for adult males who have no symptoms. Screening includes self-exam, a health care provider exam, and other screening tests. Consult with your health care provider about any symptoms you have or any concerns you have about testicular cancer.  Use sunscreen. Apply sunscreen liberally and repeatedly throughout the day. You should seek shade when your shadow is shorter than you. Protect yourself by wearing long sleeves, pants, a wide-brimmed hat, and sunglasses year round, whenever you are outdoors.  Once a month, do a whole-body skin exam, using a mirror to look at the skin on your back. Tell your health care provider about new moles, moles that have irregular borders, moles that are larger than a pencil eraser, or moles that have changed in shape or color.  Stay current with required vaccines (immunizations).  Influenza vaccine. All adults should be immunized every year.  Tetanus, diphtheria, and acellular pertussis (Td, Tdap) vaccine. An adult who has not previously received Tdap or who does not know his vaccine status should receive 1 dose of Tdap. This initial dose should be followed by tetanus and diphtheria toxoids (Td) booster doses every 10 years. Adults with an unknown or incomplete history of completing a 3-dose immunization series with Td-containing vaccines should begin or complete a primary immunization series including a Tdap dose. Adults should receive a Td booster every 10 years.  Varicella vaccine. An adult without evidence of immunity to varicella should receive 2 doses or a second dose if he has previously received 1 dose.  Human papillomavirus (HPV) vaccine. Males aged 2-21 years who have not received the vaccine previously should receive the 3-dose series. Males aged 22-26 years may be immunized. Immunization is recommended through the age of 71 years for  any male who has sex with males and did not get any or all doses earlier. Immunization is recommended for any person with an immunocompromised condition through the age of 46 years if he did not get any or all doses earlier. During the 3-dose series, the second dose should be obtained 4-8 weeks after the first dose. The third dose should be obtained 24 weeks after the first dose and 16 weeks after the second dose.  Zoster vaccine. One dose is recommended for adults aged 74 years or older unless certain conditions are present.  Measles, mumps, and  rubella (MMR) vaccine. Adults born before [birth year] generally are considered immune to measles and mumps. Adults born in [birth year] or later should have 1 or more doses of MMR vaccine unless there is a contraindication to the vaccine or there is laboratory evidence of immunity to each of the three diseases. A routine second dose of MMR vaccine should be obtained at least 28 days after the first dose for students attending postsecondary schools, health care workers, or international travelers. People who received inactivated measles vaccine or an unknown type of measles vaccine during 1963-1967 should receive 2 doses of MMR vaccine. People who received inactivated mumps vaccine or an unknown type of mumps vaccine before 1979 and are at high risk for mumps infection should consider immunization with 2 doses of MMR vaccine. Unvaccinated health care workers born before [birth year] who lack laboratory evidence of measles, mumps, or rubella immunity or laboratory confirmation of disease should consider measles and mumps immunization with 2 doses of MMR vaccine or rubella immunization with 1 dose of MMR vaccine.  Pneumococcal 13-valent conjugate (PCV13) vaccine. When indicated, a person who is uncertain of his immunization history and has no record of immunization should receive the PCV13 vaccine. An adult aged 88 years or older who has certain medical conditions and has not been previously  immunized should receive 1 dose of PCV13 vaccine. This PCV13 should be followed with a dose of pneumococcal polysaccharide (PPSV23) vaccine. The PPSV23 vaccine dose should be obtained at least 8 weeks after the dose of PCV13 vaccine. An adult aged 85 years or older who has certain medical conditions and previously received 1 or more doses of PPSV23 vaccine should receive 1 dose of PCV13. The PCV13 vaccine dose should be obtained 1 or more years after the last PPSV23 vaccine dose.  Pneumococcal polysaccharide (PPSV23) vaccine. When PCV13 is also indicated, PCV13 should be obtained first. All adults aged 55 years and older should be immunized. An adult younger than age 47 years who has certain medical conditions should be immunized. Any person who resides in a nursing home or long-term care facility should be immunized. An adult smoker should be immunized. People with an immunocompromised condition and certain other conditions should receive both PCV13 and PPSV23 vaccines. People with human immunodeficiency virus (HIV) infection should be immunized as soon as possible after diagnosis. Immunization during chemotherapy or radiation therapy should be avoided. Routine use of PPSV23 vaccine is not recommended for American Indians, [place] Natives, or people younger than 65 years unless there are medical conditions that require PPSV23 vaccine. When indicated, people who have unknown immunization and have no record of immunization should receive PPSV23 vaccine. One-time revaccination 5 years after the first dose of PPSV23 is recommended for people aged 19-64 years who have chronic kidney failure, nephrotic syndrome, asplenia, or immunocompromised conditions. People who received 1-2 doses of PPSV23 before age 22 years should receive another dose of PPSV23 vaccine at age 46 years or later if at least 5 years have passed since the previous dose. Doses of PPSV23 are not needed for people immunized with PPSV23 at or after age 46  years.  Meningococcal vaccine. Adults with asplenia or persistent complement component deficiencies should receive 2 doses of quadrivalent meningococcal conjugate (MenACWY-D) vaccine. The doses should be obtained at least 2 months apart. Microbiologists working with certain meningococcal bacteria, [employer] recruits, people at risk during an outbreak, and people who travel to or live in countries with a high rate of meningitis should be immunized. A first-year college  student up through age 35 years who is living in a residence hall should receive a dose if he did not receive a dose on or after his 16th birthday. Adults who have certain high-risk conditions should receive one or more doses of vaccine.  Hepatitis A vaccine. Adults who wish to be protected from this disease, have certain high-risk conditions, work with hepatitis A-infected animals, work in hepatitis A research labs, or travel to or work in countries with a high rate of hepatitis A should be immunized. Adults who were previously unvaccinated and who anticipate close contact with an international adoptee during the first 60 days after arrival in the [place] from a country with a high rate of hepatitis A should be immunized.  Hepatitis B vaccine. Adults should be immunized if they wish to be protected from this disease, have certain high-risk conditions, may be exposed to blood or other infectious body fluids, are household contacts or sex partners of hepatitis B positive people, are clients or workers in certain care facilities, or travel to or work in countries with a high rate of hepatitis B.

## 2014-08-09 LAB — BMP8+EGFR
BUN/Creatinine Ratio: 24 — ABNORMAL HIGH (ref 10–22)
BUN: 44 mg/dL — AB (ref 10–36)
CHLORIDE: 98 mmol/L (ref 97–108)
CO2: 25 mmol/L (ref 18–29)
Calcium: 9.3 mg/dL (ref 8.6–10.2)
Creatinine, Ser: 1.84 mg/dL — ABNORMAL HIGH (ref 0.76–1.27)
GFR calc non Af Amer: 30 mL/min/{1.73_m2} — ABNORMAL LOW (ref >59)
GFR, EST AFRICAN AMERICAN: 35 mL/min/{1.73_m2} — AB (ref >59)
GLUCOSE: 82 mg/dL (ref 65–99)
Potassium: 5.8 mmol/L — ABNORMAL HIGH (ref 3.5–5.2)
Sodium: 139 mmol/L (ref 134–144)

## 2014-08-11 ENCOUNTER — Telehealth: Payer: Self-pay | Admitting: Pharmacist

## 2014-08-11 NOTE — Telephone Encounter (Signed)
Last potassium level was 5.8 on 08/08/14

## 2014-08-11 NOTE — Telephone Encounter (Signed)
Patient's serum creatinine and potassium were elevated.  Called with results.  Also he is to hold potassium for next 7 days until we recheck 08/18/14.  His helper Bethena Roys confirmed which pill was potassium and she removed for next 2 days.  Also called [facility] and they will not include in next week's medication packaging.

## 2014-08-18 ENCOUNTER — Ambulatory Visit (INDEPENDENT_AMBULATORY_CARE_PROVIDER_SITE_OTHER): Payer: Medicare Other | Admitting: Pharmacist

## 2014-08-18 DIAGNOSIS — I4891 Unspecified atrial fibrillation: Secondary | ICD-10-CM

## 2014-08-18 DIAGNOSIS — R7989 Other specified abnormal findings of blood chemistry: Secondary | ICD-10-CM

## 2014-08-18 LAB — POCT INR: INR: 1.9

## 2014-08-18 NOTE — Patient Instructions (Signed)
Anticoagulation Dose Instructions as of 08/18/2014     [name] [name] Tue Wed Thu Fri Sat   New Dose 3 mg 3 mg 4.5 mg 3 mg 4.5 mg 3 mg 4.5 mg    Description       Continue warfarin 3mg  1 tablet daily except 4.5mg  tuesdays, thursdays and saturdays.      INR was 1.9 today

## 2014-08-19 ENCOUNTER — Other Ambulatory Visit: Payer: Self-pay | Admitting: Family Medicine

## 2014-08-19 LAB — BMP8+EGFR
BUN/Creatinine Ratio: 27 — ABNORMAL HIGH (ref 10–22)
BUN: 33 mg/dL (ref 10–36)
CHLORIDE: 100 mmol/L (ref 97–108)
CO2: 28 mmol/L (ref 18–29)
Calcium: 9.4 mg/dL (ref 8.6–10.2)
Creatinine, Ser: 1.22 mg/dL (ref 0.76–1.27)
GFR calc Af Amer: 57 mL/min/{1.73_m2} — ABNORMAL LOW (ref >59)
GFR, EST NON AFRICAN AMERICAN: 50 mL/min/{1.73_m2} — AB (ref >59)
GLUCOSE: 81 mg/dL (ref 65–99)
POTASSIUM: 5.4 mmol/L — AB (ref 3.5–5.2)
Sodium: 140 mmol/L (ref 134–144)

## 2014-08-20 ENCOUNTER — Other Ambulatory Visit: Payer: Self-pay | Admitting: Family Medicine

## 2014-08-20 NOTE — Telephone Encounter (Signed)
Discussed with [name]. Ok to refill.

## 2014-09-01 ENCOUNTER — Ambulatory Visit (INDEPENDENT_AMBULATORY_CARE_PROVIDER_SITE_OTHER): Payer: Medicare Other | Admitting: Pharmacist

## 2014-09-01 DIAGNOSIS — E875 Hyperkalemia: Secondary | ICD-10-CM

## 2014-09-01 DIAGNOSIS — I4891 Unspecified atrial fibrillation: Secondary | ICD-10-CM

## 2014-09-01 LAB — POCT INR: INR: 1.9

## 2014-09-01 NOTE — Patient Instructions (Signed)
Anticoagulation Dose Instructions as of 09/01/2014     [name] [name] Tue Wed Thu Fri Sat   New Dose 4.5 mg 3 mg 4.5 mg 3 mg 4.5 mg 3 mg 4.5 mg    Description       Increase warfarin 3mg  1 Mondays, Wednesdays and Fridays and 1 and 1/2 tablets all other day (4.5mg )      INR was 1.9 today

## 2014-09-02 ENCOUNTER — Other Ambulatory Visit: Payer: Self-pay | Admitting: Family Medicine

## 2014-09-02 LAB — BMP8+EGFR
BUN/Creatinine Ratio: 20 (ref 10–22)
BUN: 20 mg/dL (ref 10–36)
CALCIUM: 9.3 mg/dL (ref 8.6–10.2)
CO2: 27 mmol/L (ref 18–29)
Chloride: 100 mmol/L (ref 97–108)
Creatinine, Ser: 1.01 mg/dL (ref 0.76–1.27)
GFR calc Af Amer: 72 mL/min/{1.73_m2} (ref >59)
GFR, EST NON AFRICAN AMERICAN: 62 mL/min/{1.73_m2} (ref >59)
GLUCOSE: 89 mg/dL (ref 65–99)
POTASSIUM: 4.6 mmol/L (ref 3.5–5.2)
SODIUM: 142 mmol/L (ref 134–144)

## 2014-09-10 ENCOUNTER — Other Ambulatory Visit: Payer: Self-pay | Admitting: Pharmacist

## 2014-09-11 NOTE — Telephone Encounter (Signed)
Quanity? Are directions correct, just clarifying.

## 2014-09-11 NOTE — Progress Notes (Signed)
Patient ID: [name] [name], male   DOB: June 03, 1915, 80 y.o.   MRN: [id number]  Patient was actually changed to isosorbide 10mg  BID not QD

## 2014-09-23 ENCOUNTER — Ambulatory Visit: Payer: Medicare Other | Admitting: Family Medicine

## 2014-09-29 ENCOUNTER — Other Ambulatory Visit: Payer: Self-pay | Admitting: Family Medicine

## 2014-09-30 ENCOUNTER — Encounter: Payer: Self-pay | Admitting: Family Medicine

## 2014-09-30 ENCOUNTER — Ambulatory Visit (INDEPENDENT_AMBULATORY_CARE_PROVIDER_SITE_OTHER): Payer: Medicare Other | Admitting: Family Medicine

## 2014-09-30 VITALS — BP 136/74 | HR 88 | Temp 97.3°F | Ht 65.0 in | Wt 170.0 lb

## 2014-09-30 DIAGNOSIS — I771 Stricture of artery: Secondary | ICD-10-CM

## 2014-09-30 DIAGNOSIS — I4819 Other persistent atrial fibrillation: Secondary | ICD-10-CM

## 2014-09-30 DIAGNOSIS — I481 Persistent atrial fibrillation: Secondary | ICD-10-CM

## 2014-09-30 DIAGNOSIS — R54 Age-related physical debility: Secondary | ICD-10-CM

## 2014-09-30 DIAGNOSIS — E559 Vitamin D deficiency, unspecified: Secondary | ICD-10-CM

## 2014-09-30 DIAGNOSIS — Z23 Encounter for immunization: Secondary | ICD-10-CM

## 2014-09-30 DIAGNOSIS — R600 Localized edema: Secondary | ICD-10-CM

## 2014-09-30 DIAGNOSIS — R350 Frequency of micturition: Secondary | ICD-10-CM

## 2014-09-30 DIAGNOSIS — D696 Thrombocytopenia, unspecified: Secondary | ICD-10-CM | POA: Insufficient documentation

## 2014-09-30 NOTE — Progress Notes (Signed)
Subjective:    Patient ID: [name] [name], male    DOB: 1914-12-31, 80 y.o.   MRN: [id number]  HPI Pt is here today for follow up of chronic medical conditions which include CAD, hyperlipidemia and BPH. The patient's review of systems were negative. He is definitely beginning to lose his memory and is becoming more feeble in appearance and with his gait. I discussed with him again at leg about going to an assisted living facility. He indicates that he just doesn't want to leave his time. I told him that he could go and stay there for a month and if he didn't like it he can go back home. He says he wants to keep thinking about this.           Patient Active Problem List   Diagnosis Date Noted  . At high risk for falls 08/08/2014  . Frailty 05/21/2014  . HYPERLIPIDEMIA-MIXED 03/19/2009  . CAD, UNSPECIFIED SITE 03/19/2009  . ATRIAL FIBRILLATION 03/19/2009  . EDEMA 03/19/2009  . BENIGN PROSTATIC HYPERTROPHY, HX OF 03/19/2009   Outpatient Encounter Prescriptions as of 09/30/2014  Medication Sig  . Calcium Carbonate-Vitamin D 600-400 MG-UNIT per tablet Take 1 tablet by mouth daily.    Marland Kitchen desoximetasone (TOPICORT) 0.05 % cream Apply topically 2 (two) times daily.  Marland Kitchen docusate sodium (COLACE) 100 MG capsule Take 100 mg by mouth as needed.    . furosemide (LASIX) 40 MG tablet Take 1 tablet (40 mg total) by mouth daily.  . isosorbide dinitrate (ISORDIL) 10 MG tablet Take 1 tablet (10 mg total) by mouth 2 (two) times daily.  Marland Kitchen lisinopril (PRINIVIL,ZESTRIL) 10 MG tablet TAKE 1 TABLET ONCE A DAY  . metoprolol (LOPRESSOR) 50 MG tablet TAKE (1) TABLET TWICE A DAY.  Marland Kitchen nystatin-triamcinolone ointment (MYCOLOG) Apply 1 application topically 2 (two) times daily.  . Omega-3 Fatty Acids (FISH OIL) 1000 MG CAPS Take 3 capsules by mouth daily. 1000 mg?   . omeprazole (PRILOSEC) 20 MG capsule TAKE (1) CAPSULE DAILY  . senna (SENOKOT) 8.6 MG tablet Take 1 tablet by mouth daily. prn  . simvastatin  (ZOCOR) 40 MG tablet TAKE 1 TABLET IN THE EVENING FOR CHOLESTEROL  . triamcinolone cream (KENALOG) 0.1 % Pharmacy to mix 120g of trimacinolone 1% with 120g of eucerin Apply to legs BID  . warfarin (COUMADIN) 3 MG tablet TAKE 1.5 TABLETS DAILY EXCEPT 1 ON MON, WED, AND FRI     Review of Systems  HENT: Negative.   Eyes: Negative.   Respiratory: Negative for cough and shortness of breath.   Cardiovascular: Negative for chest pain and palpitations.  Gastrointestinal: Negative.   Genitourinary: Negative.   Musculoskeletal: Negative.   Allergic/Immunologic: Negative.   Neurological: Negative for dizziness and headaches.  Psychiatric/Behavioral: Negative.        Objective:   Physical Exam  Nursing note and vitals reviewed. Constitutional: He is oriented to person, place, and time. He appears well-developed and well-nourished. No distress.  For his age  HENT:  Head: Normocephalic and atraumatic.  Right Ear: External ear normal.  Left Ear: External ear normal.  Nose: Nose normal.  Mouth/Throat: Oropharynx is clear and moist. No oropharyngeal exudate.  Decreased hearing  Eyes: Conjunctivae and EOM are normal. Pupils are equal, round, and reactive to light. Right eye exhibits no discharge. Left eye exhibits no discharge. No scleral icterus.  Neck: Normal range of motion. Neck supple. No thyromegaly present.  No carotid bruits were audible  Cardiovascular: Normal rate, normal heart  sounds and intact distal pulses.  Exam reveals no gallop and no friction rub.   No murmur heard.  Irregular irregular at 96 per minute  Pulmonary/Chest: Effort normal and breath sounds normal. No respiratory distress. He has no wheezes. He has no rales. He exhibits no tenderness.  Abdominal: Soft. Bowel sounds are normal. He exhibits no mass. There is no tenderness. There is no rebound and no guarding.  Musculoskeletal: He exhibits edema (1+ pretibial edema with varicosities on both feet). He exhibits no  tenderness.  The patient uses a cane to steady his gait. He is somewhat weak and has a wide gait to maintain his balance   Lymphadenopathy:    He has no cervical adenopathy.  Neurological: He is alert and oriented to person, place, and time. He has normal reflexes. No cranial nerve deficit.  Skin: Skin is warm and dry. No rash noted. No erythema. No pallor.  Psychiatric: He has a normal mood and affect. His behavior is normal. Judgment and thought content normal.  The patient is resistant to moving from his home.   BP 136/74  Pulse 88  Temp(Src) 97.3 F (36.3 C) (Oral)  Ht 5\' 5"  (1.651 m)  Wt 170 lb (77.111 kg)  BMI 28.29 kg/m2        Assessment & Plan:   1. Edema of both legs  2. Persistent atrial fibrillation  3. Vitamin D deficiency  4. Frailty  5. Platelets decreased  6. Frequency of urination  7. Thrombocytopenia  8. Tortuous aorta  No orders of the defined types were placed in this encounter.   Patient Instructions  Continue current medications. Continue good therapeutic lifestyle changes which include good diet and exercise. Fall precautions discussed with patient. If an FOBT was given today- please return it to our front desk. If you are over 25 years old - you may need Prevnar 55 or the adult Pneumonia vaccine.  Flu Shots will be available at our office starting mid- September. Please call and schedule a FLU CLINIC APPOINTMENT.   Please consider [place] to continue to think about going to the assisted living facility Please move slowly and use your cane for ambulation Continue to drink plenty of fluids   Arrie Senate MD

## 2014-09-30 NOTE — Patient Instructions (Addendum)
Continue current medications. Continue good therapeutic lifestyle changes which include good diet and exercise. Fall precautions discussed with patient. If an FOBT was given today- please return it to our front desk. If you are over 78 years old - you may need Prevnar 52 or the adult Pneumonia vaccine.  Flu Shots will be available at our office starting mid- September. Please call and schedule a FLU CLINIC APPOINTMENT.   Please consider [place] to continue to think about going to the assisted living facility Please move slowly and use your cane for ambulation Continue to drink plenty of fluids

## 2014-10-08 ENCOUNTER — Other Ambulatory Visit: Payer: Self-pay | Admitting: Family Medicine

## 2014-10-23 ENCOUNTER — Ambulatory Visit (INDEPENDENT_AMBULATORY_CARE_PROVIDER_SITE_OTHER): Payer: Medicare Other | Admitting: Pharmacist

## 2014-10-23 DIAGNOSIS — E875 Hyperkalemia: Secondary | ICD-10-CM

## 2014-10-23 DIAGNOSIS — I4891 Unspecified atrial fibrillation: Secondary | ICD-10-CM

## 2014-10-23 LAB — POCT INR: INR: 3.4

## 2014-10-23 NOTE — Patient Instructions (Signed)
Anticoagulation Dose Instructions as of 10/23/2014      Dorene Grebe Tue Wed Thu Fri Sat   New Dose 3 mg 3 mg 4.5 mg 3 mg 4.5 mg 3 mg 4.5 mg    Description        No warfarin today - Thursday, November 5th, then decrease warfarin 3mg  =  1 tablet on Sundays, Mondays, Wednesdays and Fridays and 1 and 1/2 tablets (= 4.5mg ) Tuesdays, Thursdays and Saturdays.      INR was 3.4 today

## 2014-10-23 NOTE — Progress Notes (Signed)
Patient with history of elevated serum creatinine and potassium - has been off potassium supplement for about 2 months. Checking potassium and serum creatinine today -  Pending.

## 2014-10-24 LAB — BMP8+EGFR
BUN/Creatinine Ratio: 17 (ref 10–22)
BUN: 19 mg/dL (ref 10–36)
CHLORIDE: 101 mmol/L (ref 97–108)
CO2: 25 mmol/L (ref 18–29)
Calcium: 8.8 mg/dL (ref 8.6–10.2)
Creatinine, Ser: 1.12 mg/dL (ref 0.76–1.27)
GFR calc Af Amer: 64 mL/min/{1.73_m2} (ref >59)
GFR calc non Af Amer: 55 mL/min/{1.73_m2} — ABNORMAL LOW (ref >59)
Glucose: 82 mg/dL (ref 65–99)
Potassium: 3.9 mmol/L (ref 3.5–5.2)
SODIUM: 141 mmol/L (ref 134–144)

## 2014-11-05 ENCOUNTER — Other Ambulatory Visit: Payer: Self-pay | Admitting: Pharmacist

## 2014-11-06 ENCOUNTER — Ambulatory Visit (INDEPENDENT_AMBULATORY_CARE_PROVIDER_SITE_OTHER): Payer: Medicare Other | Admitting: Pharmacist

## 2014-11-06 DIAGNOSIS — L03115 Cellulitis of right lower limb: Secondary | ICD-10-CM

## 2014-11-06 DIAGNOSIS — L03116 Cellulitis of left lower limb: Secondary | ICD-10-CM

## 2014-11-06 DIAGNOSIS — E876 Hypokalemia: Secondary | ICD-10-CM

## 2014-11-06 DIAGNOSIS — I4891 Unspecified atrial fibrillation: Secondary | ICD-10-CM

## 2014-11-06 LAB — POCT INR: INR: 3.8

## 2014-11-06 MED ORDER — DOXYCYCLINE HYCLATE 100 MG PO TABS: 100.0000 mg | ORAL_TABLET | Freq: Two times a day (BID) | ORAL | Status: DC

## 2014-11-06 MED ORDER — FLUOCINONIDE EMULSIFIED BASE 0.05 % EX CREA: 1.0000 "application " | TOPICAL_CREAM | Freq: Two times a day (BID) | CUTANEOUS | Status: DC

## 2014-11-06 NOTE — Progress Notes (Signed)
Patient's legs appeared to be worse today then in the last month.  Dr Laurance Flatten came in to evaluated red, inflamed lower extremities.   Trace LEE.  Erythema bilaterally extending from ankles about 8 inches.  Per Dr Laurance Flatten not warm to the tough.   KOH of legs scrapping was negative for hyphi or fungi.   Recommendation: Dr Laurance Flatten prescribed doxycycline 100mg  BID for 10 days and lidex E cream - apply to lower legs BID.  Follow up in 11 days.

## 2014-11-06 NOTE — Patient Instructions (Signed)
Anticoagulation Dose Instructions as of 11/06/2014      [name] [name] Tue Wed Thu Fri Sat   New Dose 3 mg 3 mg 4.5 mg 3 mg 3 mg 3 mg 3 mg    Description        No warfarin today - Thursday, November 19th, then decrease warfarin 3mg  =   1 and 1/2 tablets (= 4.5mg ) Tuesdays and 1 tablet all other days.      INR was 3.8 today (too thin)  Call Alese Furniss if you have questions [phone] ext [phone]

## 2014-11-07 LAB — BMP8+EGFR
BUN / CREAT RATIO: 20 (ref 10–22)
BUN: 23 mg/dL (ref 10–36)
CHLORIDE: 101 mmol/L (ref 97–108)
CO2: 28 mmol/L (ref 18–29)
Calcium: 8.9 mg/dL (ref 8.6–10.2)
Creatinine, Ser: 1.17 mg/dL (ref 0.76–1.27)
GFR calc non Af Amer: 52 mL/min/{1.73_m2} — ABNORMAL LOW (ref >59)
GFR, EST AFRICAN AMERICAN: 60 mL/min/{1.73_m2} (ref >59)
GLUCOSE: 82 mg/dL (ref 65–99)
POTASSIUM: 4.5 mmol/L (ref 3.5–5.2)
SODIUM: 143 mmol/L (ref 134–144)

## 2014-11-17 ENCOUNTER — Ambulatory Visit (INDEPENDENT_AMBULATORY_CARE_PROVIDER_SITE_OTHER): Payer: Medicare Other | Admitting: Pharmacist

## 2014-11-17 DIAGNOSIS — I4891 Unspecified atrial fibrillation: Secondary | ICD-10-CM

## 2014-11-17 LAB — POCT INR: INR: 3

## 2014-11-17 NOTE — Progress Notes (Signed)
Patient has been taking doxycycline 100mg  1 tablet bid for last 9 days for lower extremity cellulitis.  He will finish course today.  He has also been applying fluocinonide bid to lower extremeties.  Trace LEE. Mild erythema bilaterally - right leg worse than left.  Erythema has regressed compared to 10 days ago.  Skin not warm to tough.   INR was 3.0 today (down from 3.8 10 days ago)  Assessement;  Cellulitis of both lower extremities Therapeutic anticoagulation   Recommendation: Finish ABX therapy and continue to apply fluocinonide until finishes current tube.   Anticoagulation Dose Instructions as of 11/17/2014      Dorene Grebe Tue Wed Thu Fri Sat   New Dose 3 mg 3 mg 4.5 mg 3 mg 3 mg 3 mg 3 mg    Description        Finish antibiotic - doxycycline.  Continue warfarin 3mg  =   1 and 1/2 tablets (= 4.5mg ) Tuesdays and 1 tablet all other days.     RTC in 2 weeks to check INR and legs.   Cherre Robins, PharmD, CPP

## 2014-11-17 NOTE — Patient Instructions (Signed)
Anticoagulation Dose Instructions as of 11/17/2014      [name] [name] Tue Wed Thu Fri Sat   New Dose 3 mg 3 mg 4.5 mg 3 mg 3 mg 3 mg 3 mg    Description        Finish antibiotic - doxycycline.  Continue warfarin 3mg  =   1 and 1/2 tablets (= 4.5mg ) Tuesdays and 1 tablet all other days.      INR was 3.0 today

## 2014-11-18 ENCOUNTER — Other Ambulatory Visit: Payer: Self-pay | Admitting: Family Medicine

## 2014-11-21 ENCOUNTER — Encounter (INDEPENDENT_AMBULATORY_CARE_PROVIDER_SITE_OTHER): Payer: Medicare Other | Admitting: Family Medicine

## 2014-11-21 ENCOUNTER — Emergency Department (HOSPITAL_COMMUNITY)
Admission: EM | Admit: 2014-11-21 | Discharge: 2014-11-21 | Disposition: A | Discharge status: Home / Self Care | Payer: Medicare Other | Attending: Emergency Medicine | Admitting: Emergency Medicine

## 2014-11-21 ENCOUNTER — Emergency Department (HOSPITAL_COMMUNITY): Payer: Medicare Other

## 2014-11-21 ENCOUNTER — Encounter (HOSPITAL_COMMUNITY): Payer: Self-pay | Admitting: *Deleted

## 2014-11-21 DIAGNOSIS — Z880 Allergy status to penicillin: Secondary | ICD-10-CM | POA: Diagnosis not present

## 2014-11-21 DIAGNOSIS — Z7952 Long term (current) use of systemic steroids: Secondary | ICD-10-CM | POA: Insufficient documentation

## 2014-11-21 DIAGNOSIS — I4891 Unspecified atrial fibrillation: Secondary | ICD-10-CM | POA: Insufficient documentation

## 2014-11-21 DIAGNOSIS — Z79899 Other long term (current) drug therapy: Secondary | ICD-10-CM | POA: Insufficient documentation

## 2014-11-21 DIAGNOSIS — Z7901 Long term (current) use of anticoagulants: Secondary | ICD-10-CM | POA: Diagnosis not present

## 2014-11-21 DIAGNOSIS — Z87448 Personal history of other diseases of urinary system: Secondary | ICD-10-CM | POA: Diagnosis not present

## 2014-11-21 DIAGNOSIS — Z792 Long term (current) use of antibiotics: Secondary | ICD-10-CM | POA: Diagnosis not present

## 2014-11-21 DIAGNOSIS — H1132 Conjunctival hemorrhage, left eye: Secondary | ICD-10-CM | POA: Diagnosis not present

## 2014-11-21 DIAGNOSIS — I2510 Atherosclerotic heart disease of native coronary artery without angina pectoris: Secondary | ICD-10-CM | POA: Insufficient documentation

## 2014-11-21 DIAGNOSIS — Z951 Presence of aortocoronary bypass graft: Secondary | ICD-10-CM | POA: Insufficient documentation

## 2014-11-21 DIAGNOSIS — H5712 Ocular pain, left eye: Secondary | ICD-10-CM

## 2014-11-21 LAB — CBC WITH DIFFERENTIAL/PLATELET
Basophils Absolute: 0 10*3/uL (ref 0.0–0.1)
Basophils Relative: 1 % (ref 0–1)
Eosinophils Absolute: 0.2 10*3/uL (ref 0.0–0.7)
Eosinophils Relative: 3 % (ref 0–5)
HCT: 38.7 % — ABNORMAL LOW (ref 39.0–52.0)
HEMOGLOBIN: 12.6 g/dL — AB (ref 13.0–17.0)
LYMPHS ABS: 1.4 10*3/uL (ref 0.7–4.0)
Lymphocytes Relative: 19 % (ref 12–46)
MCH: 29.9 pg (ref 26.0–34.0)
MCHC: 32.6 g/dL (ref 30.0–36.0)
MCV: 91.9 fL (ref 78.0–100.0)
MONOS PCT: 10 % (ref 3–12)
Monocytes Absolute: 0.7 10*3/uL (ref 0.1–1.0)
NEUTROS ABS: 5 10*3/uL (ref 1.7–7.7)
NEUTROS PCT: 67 % (ref 43–77)
Platelets: 130 10*3/uL — ABNORMAL LOW (ref 150–400)
RBC: 4.21 MIL/uL — AB (ref 4.22–5.81)
RDW: 14.6 % (ref 11.5–15.5)
WBC: 7.4 10*3/uL (ref 4.0–10.5)

## 2014-11-21 LAB — BASIC METABOLIC PANEL
ANION GAP: 12 (ref 5–15)
BUN: 22 mg/dL (ref 6–23)
CHLORIDE: 103 meq/L (ref 96–112)
CO2: 28 mEq/L (ref 19–32)
Calcium: 9.4 mg/dL (ref 8.4–10.5)
Creatinine, Ser: 1.05 mg/dL (ref 0.50–1.35)
GFR calc Af Amer: 66 mL/min — ABNORMAL LOW (ref >90)
GFR calc non Af Amer: 57 mL/min — ABNORMAL LOW (ref >90)
Glucose, Bld: 108 mg/dL — ABNORMAL HIGH (ref 70–99)
POTASSIUM: 4.1 meq/L (ref 3.7–5.3)
Sodium: 143 mEq/L (ref 137–147)

## 2014-11-21 LAB — POCT INR: INR: 2.7

## 2014-11-21 LAB — PROTIME-INR
INR: 2.45 — AB (ref 0.00–1.49)
Prothrombin Time: 26.8 seconds — ABNORMAL HIGH (ref 11.6–15.2)

## 2014-11-21 NOTE — ED Notes (Signed)
Discharge instructions given, pt demonstrated teach back and verbal understanding. No concerns voiced.  

## 2014-11-21 NOTE — Discharge Instructions (Signed)
Hold Coumadin for now. Follow-up with eye doctor next week.  Phone number given for Dr. Iona Hansen

## 2014-11-21 NOTE — ED Notes (Addendum)
Sent from Dr Tawanna Sat office, for eval  Pt is non coumadin.  Lt eye pain  Contusion periorbital region and conjunctival hemorrhage.   Denies injury.

## 2014-11-21 NOTE — Progress Notes (Signed)
This encounter was created in error - please disregard.

## 2014-11-22 NOTE — ED Provider Notes (Signed)
CSN: [id number]     Arrival date & time 11/21/14  1744 History   First MD Initiated Contact with Patient 11/21/14 1816     Chief Complaint  Patient presents with  . Eye Injury     (Consider location/radiation/quality/duration/timing/severity/associated sxs/prior Treatment) HPI.... Redness in left eye globe starting this morning. Patient seen by primary care doctor today. He is presently on Coumadin. No trauma to the orbit. Vision is described as normal. No other injuries. No other complaints.  Past Medical History  Diagnosis Date  . Edema   . BPH (benign prostatic hypertrophy)     hx  . Long term (current) use of anticoagulants     coumadin theraoy  . Atrial fibrillation   . HLD (hyperlipidemia)     miixed  . CAD (coronary artery disease)   . Cataract    Past Surgical History  Procedure Laterality Date  . Coronary artery bypass graft  1998    LIMA to LAD, SVG to diagonal, SVG to OM1, SVG to OM2, OM to PDA. had a cath in 2001 demonstratig grafts to be patent  . Cholecystectomy    . Bilateral inguinal hernia repairs    . Cardiac surgery    . Hernia repair     Family History  Problem Relation Age of Onset  . Cancer      fhx  . Coronary artery disease      fhx  . Diabetes      fhx; also of alcoholism   . Hip fracture Mother   . Heart attack Father    History  Substance Use Topics  . Smoking status: Never Smoker   . Smokeless tobacco: Never Used     Comment: tobacco use - no   . Alcohol Use: No    Review of Systems  All other systems reviewed and are negative.     Allergies  Penicillins  Home Medications   Prior to Admission medications   Medication Sig Start Date End Date Taking? Authorizing Provider  Calcium Carbonate-Vitamin D 600-400 MG-UNIT per tablet Take 1 tablet by mouth daily.      Historical Provider, MD  desoximetasone (TOPICORT) 0.05 % cream Apply topically 2 (two) times daily. 05/21/14   Chipper Herb, MD  docusate sodium (COLACE) 100 MG  capsule Take 100 mg by mouth as needed.      Historical Provider, MD  doxycycline (VIBRA-TABS) 100 MG tablet Take 1 tablet (100 mg total) by mouth 2 (two) times daily. Patient not taking: Reported on 11/21/2014 11/06/14   Chipper Herb, MD  Fluocinonide Emulsified Base 0.05 % CREA Apply 1 application topically 2 (two) times daily. 11/06/14   Chipper Herb, MD  furosemide (LASIX) 40 MG tablet Take 1 tablet (40 mg total) by mouth daily. 06/30/14   Chipper Herb, MD  furosemide (LASIX) 40 MG tablet TAKE 1 TABLET ONCE DAILY FOR 3 DAYS AS NEEDED FOR SEVERE SWELLING 10/09/14   Chipper Herb, MD  isosorbide dinitrate (ISORDIL) 10 MG tablet TAKE  (1)  TABLET TWICE A DAY. 11/05/14   Chipper Herb, MD  lisinopril (PRINIVIL,ZESTRIL) 10 MG tablet TAKE 1 TABLET ONCE A DAY 11/19/14   Chipper Herb, MD  metoprolol (LOPRESSOR) 50 MG tablet TAKE (1) TABLET TWICE A DAY. 11/19/14   Chipper Herb, MD  nystatin-triamcinolone ointment [facility]) Apply 1 application topically 2 (two) times daily. 03/27/14   Lysbeth Penner, FNP  Omega-3 Fatty Acids (FISH OIL) 1000 MG CAPS Take 3  capsules by mouth daily. 1000 mg?     Historical Provider, MD  omeprazole (PRILOSEC) 20 MG capsule TAKE (1) CAPSULE DAILY 10/01/14   Chipper Herb, MD  senna (SENOKOT) 8.6 MG tablet Take 1 tablet by mouth daily. prn    Historical Provider, MD  simvastatin (ZOCOR) 40 MG tablet TAKE 1 TABLET IN THE EVENING FOR CHOLESTEROL    Chipper Herb, MD  triamcinolone cream (KENALOG) 0.1 % Pharmacy to mix 120g of trimacinolone 1% with 120g of eucerin Apply to legs BID 07/23/14   Chipper Herb, MD  warfarin (COUMADIN) 3 MG tablet TAKE 1.5 TABLETS DAILY EXCEPT 1 ON MON, WED, AND FRI 09/03/14   Chipper Herb, MD   BP 143/68 mmHg  Pulse 89  Temp(Src) 97.9 F (36.6 C) (Oral)  Resp 14  SpO2 95% Physical Exam  Constitutional: He is oriented to person, place, and time. He appears well-developed and well-nourished.  HENT:  Head: Normocephalic and  atraumatic.  Right eye normal. Left eye shows significant conjunctival hemorrhage. Pupil is reactive. Funduscopic exam normal. Ecchymosis surrounding left orbit.  Eyes: Conjunctivae and EOM are normal. Pupils are equal, round, and reactive to light.  Neck: Normal range of motion. Neck supple.  Musculoskeletal: Normal range of motion.  Neurological: He is alert and oriented to person, place, and time.  Skin: Skin is warm and dry.  Psychiatric: He has a normal mood and affect. His behavior is normal.  Nursing note and vitals reviewed.   ED Course  Procedures (including critical care time) Labs Review Labs Reviewed  BASIC METABOLIC PANEL - Abnormal; Notable for the following:    Glucose, Bld 108 (*)    GFR calc non Af Amer 57 (*)    GFR calc Af Amer 66 (*)    All other components within normal limits  CBC WITH DIFFERENTIAL - Abnormal; Notable for the following:    RBC 4.21 (*)    Hemoglobin 12.6 (*)    HCT 38.7 (*)    Platelets 130 (*)    All other components within normal limits  PROTIME-INR - Abnormal; Notable for the following:    Prothrombin Time 26.8 (*)    INR 2.45 (*)    All other components within normal limits    Imaging Review Ct Orbitss W/o Cm  11/21/2014   CLINICAL DATA:  Left orbital injury, contusion, swelling  EXAM: CT ORBITS WITHOUT CONTRAST  TECHNIQUE: Multidetector CT imaging of the orbits was performed following the standard protocol without intravenous contrast.  COMPARISON:  None.  FINDINGS: Orbits appear symmetric and intact. No orbital fracture or blowout fracture. Globes are intact. No proptosis. Minor left periorbital anterior soft tissue swelling. Visualized facial bones appear intact. Mastoids are clear. Left maxillary sinus demonstrates complete opacification with heterogeneous hyper attenuation and expansion medially into the sinus cavity. There is surrounding hyperostosis of the left maxillary walls supporting a chronic process. This could represent an  inverting papilloma versus allergic fungal chronic sinusitis.  IMPRESSION: No acute orbital fracture or osseous abnormality.  Chronic left maxillary sinus expansile opacification with heterogeneous hyper attenuation compatible with inverting papilloma or allergic fungal chronic sinusitis. Recommend follow-up nonemergent ENT evaluation.   Electronically Signed   By: Daryll Brod M.D.   On: 11/21/2014 20:12     EKG Interpretation None      MDM   Final diagnoses:  Left eye pain  Subconjunctival hematoma, left    CT scan obtained to rule out orbital fracture.  No fracture identified.  Exam consistent with subchondral time hemorrhage. Patient advised to hold Coumadin. Referral to local ophthalmologist.    Nat Christen, MD 11/22/14 [phone]

## 2014-11-25 ENCOUNTER — Other Ambulatory Visit: Payer: Self-pay | Admitting: *Deleted

## 2014-11-25 DIAGNOSIS — J320 Chronic maxillary sinusitis: Secondary | ICD-10-CM

## 2014-12-01 ENCOUNTER — Ambulatory Visit (INDEPENDENT_AMBULATORY_CARE_PROVIDER_SITE_OTHER): Payer: Medicare Other | Admitting: Pharmacist

## 2014-12-01 DIAGNOSIS — L03115 Cellulitis of right lower limb: Secondary | ICD-10-CM

## 2014-12-01 DIAGNOSIS — I4891 Unspecified atrial fibrillation: Secondary | ICD-10-CM

## 2014-12-01 LAB — POCT INR: INR: 1.4

## 2014-12-01 MED ORDER — ASPIRIN EC 81 MG PO TBEC: 81.0000 mg | DELAYED_RELEASE_TABLET | Freq: Every day | ORAL | Status: DC

## 2014-12-01 NOTE — Progress Notes (Signed)
CC: recheck protime  HPI:  Patient is atrial fibrillation on warfarin for clot prevention.  He recently was seen in ER due to hemorrhage in left eye.  His eye is healing and he is due to see opthalmologist next Monday 12/21/2015for follow up.  Unsure what caused hemorrhage.   At time INR was therapeutic but opthalmologist thought was most likely related to anticoagulation therapy.  Patient also has had lower extremity cellulitis.  He has finished course of antibiotics and redness has decreased.   He has continues to apply fluocinonide bid to lower extremeties.  Trace LEE. Mild erythema bilaterally  INR was 1.4 today (was held last week for 5 days after eye hemorrhage)  Assessement;  Cellulitis of both lower extremities - improved Sub therapeutic anticoagulation -  Atrial Fibrillation - risk vs benefit analysis of using anticoagulant in this patient.  Discussed with his PCP Dr Laurance Flatten.   Recommendation: After discussed with his PCP, his sister and patient it was decided to discontinue warfarin due to several factors - high fall risk, patient lives alone, he no longer drives and much depend on family to bring to office for at minimum monthly INR checks and recent hemorrhage / adverse event Start ASA 81mg  1 tablet daily Continue to monitor legs for redness and swelling - patient to notify office if conditions does not continue to improve.   RTC in 2 months to see PCP  Cherre Robins, PharmD, CPP

## 2014-12-01 NOTE — Patient Instructions (Signed)
  Finish current week of warfarin - then will stop and start Aspirin 81mg  once a day in its place

## 2014-12-03 ENCOUNTER — Telehealth: Payer: Self-pay | Admitting: Family Medicine

## 2014-12-08 ENCOUNTER — Other Ambulatory Visit: Payer: Self-pay | Admitting: Family Medicine

## 2014-12-11 NOTE — Telephone Encounter (Signed)
Phyllis aware of why we are doing the ENT - we will re- try the referral and call pt with appt

## 2014-12-11 NOTE — Telephone Encounter (Signed)
Lm for phyllis to call Roselyn Reef B  (refer to CT orbit )

## 2014-12-30 DIAGNOSIS — H6121 Impacted cerumen, right ear: Secondary | ICD-10-CM | POA: Diagnosis not present

## 2014-12-30 DIAGNOSIS — J341 Cyst and mucocele of nose and nasal sinus: Secondary | ICD-10-CM | POA: Diagnosis not present

## 2014-12-30 DIAGNOSIS — B49 Unspecified mycosis: Secondary | ICD-10-CM | POA: Diagnosis not present

## 2014-12-30 DIAGNOSIS — J320 Chronic maxillary sinusitis: Secondary | ICD-10-CM | POA: Diagnosis not present

## 2015-02-03 ENCOUNTER — Ambulatory Visit: Payer: Medicare Other | Admitting: Family Medicine

## 2015-02-05 ENCOUNTER — Encounter: Payer: Self-pay | Admitting: Family Medicine

## 2015-02-05 ENCOUNTER — Ambulatory Visit (INDEPENDENT_AMBULATORY_CARE_PROVIDER_SITE_OTHER): Payer: Medicare Other | Admitting: Family Medicine

## 2015-02-05 VITALS — BP 135/79 | HR 61 | Temp 96.2°F | Ht 65.0 in | Wt 176.0 lb

## 2015-02-05 DIAGNOSIS — R2681 Unsteadiness on feet: Secondary | ICD-10-CM | POA: Insufficient documentation

## 2015-02-05 DIAGNOSIS — E785 Hyperlipidemia, unspecified: Secondary | ICD-10-CM | POA: Diagnosis not present

## 2015-02-05 DIAGNOSIS — E559 Vitamin D deficiency, unspecified: Secondary | ICD-10-CM

## 2015-02-05 DIAGNOSIS — I480 Paroxysmal atrial fibrillation: Secondary | ICD-10-CM | POA: Diagnosis not present

## 2015-02-05 DIAGNOSIS — D696 Thrombocytopenia, unspecified: Secondary | ICD-10-CM | POA: Diagnosis not present

## 2015-02-05 DIAGNOSIS — I10 Essential (primary) hypertension: Secondary | ICD-10-CM

## 2015-02-05 DIAGNOSIS — Z79899 Other long term (current) drug therapy: Secondary | ICD-10-CM

## 2015-02-05 NOTE — Progress Notes (Signed)
Subjective:    Patient ID: [name] [name], male    DOB: 11-Nov-1915, 81 y.o.   MRN: [id number]  HPI Pt here for follow up and management of chronic medical problems which include hypertension, hyperlipidemia, and atrial fibrillation. He is taking medications regularly. The patient comes today with his sister and as usual has no complaints. The patient has meals on wheels. He is also taken to the grocery store by another lady who cleans his house and does his laundry. He stays at home by himself other than these visits with assistance outside the home. He also goes and visits with his sister on Sunday evenings and eats with her. He does have a monitor that he wears around his neck in case he falls at home and he understands how to use this. He is no longer taking a blood thinner due to his increased risk for falling. Overall he is doing well and maintaining his weight. He has no complaints.         Patient Active Problem List   Diagnosis Date Noted  . Platelets decreased 09/30/2014  . 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 Thrombocytopenia 09/30/2014  . At high risk for falls 08/08/2014  . Frailty 05/21/2014  . HYPERLIPIDEMIA-MIXED 03/19/2009  . CAD, UNSPECIFIED SITE 03/19/2009  . ATRIAL FIBRILLATION 03/19/2009  . EDEMA 03/19/2009  . BENIGN PROSTATIC HYPERTROPHY, HX OF 03/19/2009   Outpatient Encounter Prescriptions as of 02/05/2015  Medication Sig  . aspirin EC 81 MG tablet Take 1 tablet (81 mg total) by mouth daily.  . Calcium Carbonate-Vitamin D 600-400 MG-UNIT per tablet Take 1 tablet by mouth daily.    Marland Kitchen desoximetasone (TOPICORT) 0.05 % cream Apply topically 2 (two) times daily.  Marland Kitchen docusate sodium (COLACE) 100 MG capsule Take 100 mg by mouth as needed.    . Fluocinonide Emulsified Base 0.05 % CREA Apply 1 application topically 2 (two) times daily.  . furosemide (LASIX) 40 MG tablet Take 1 tablet (40 mg total) by mouth daily.  . isosorbide dinitrate (ISORDIL) 10 MG tablet TAKE  (1)   TABLET TWICE A DAY.  Marland Kitchen lisinopril (PRINIVIL,ZESTRIL) 10 MG tablet TAKE 1 TABLET ONCE A DAY  . metoprolol (LOPRESSOR) 50 MG tablet TAKE (1) TABLET TWICE A DAY.  Marland Kitchen Omega-3 Fatty Acids (FISH OIL) 1000 MG CAPS Take 3 capsules by mouth daily. 1000 mg?   . omeprazole (PRILOSEC) 20 MG capsule TAKE (1) CAPSULE DAILY  . senna (SENOKOT) 8.6 MG tablet Take 1 tablet by mouth daily. prn  . simvastatin (ZOCOR) 40 MG tablet TAKE 1 TABLET IN THE EVENING FOR CHOLESTEROL  . triamcinolone cream (KENALOG) 0.1 % Pharmacy to mix 120g of trimacinolone 1% with 120g of eucerin Apply to legs BID  . [DISCONTINUED] furosemide (LASIX) 40 MG tablet TAKE 1 TABLET ONCE DAILY FOR 3 DAYS AS NEEDED FOR SEVERE SWELLING  . [DISCONTINUED] nystatin-triamcinolone ointment (MYCOLOG) Apply 1 application topically 2 (two) times daily.    Review of Systems  Constitutional: Negative.   HENT: Negative.   Eyes: Negative.   Respiratory: Negative.   Cardiovascular: Negative.   Gastrointestinal: Negative.   Endocrine: Negative.   Genitourinary: Negative.   Musculoskeletal: Negative.   Skin: Negative.   Allergic/Immunologic: Negative.   Neurological: Negative.   Hematological: Negative.   Psychiatric/Behavioral: Negative.        Objective:   Physical Exam  Constitutional: He is oriented to person, place, and time. He appears well-developed and well-nourished. No distress.  For his age of 24 years  HENT:  Head: Normocephalic and atraumatic.  Right Ear: External ear normal.  Left Ear: External ear normal.  Nose: Nose normal.  Mouth/Throat: Oropharynx is clear and moist. No oropharyngeal exudate.  Eyes: Conjunctivae and EOM are normal. Pupils are equal, round, and reactive to light. Right eye exhibits no discharge. Left eye exhibits no discharge. No scleral icterus.  Neck: Normal range of motion. Neck supple. No tracheal deviation present. No thyromegaly present.  No anterior cervical nodes or carotid bruits  Cardiovascular:  Normal rate and normal heart sounds.  Exam reveals no gallop and no friction rub.   No murmur heard. The heart is irregular irregular at 72/m  Pulmonary/Chest: Effort normal and breath sounds normal. No respiratory distress. He has no wheezes. He has no rales. He exhibits no tenderness.  Abdominal: Soft. Bowel sounds are normal. He exhibits no mass. There is no tenderness. There is no rebound and no guarding.  The abdomen is nontender and without masses or organ enlargement  Musculoskeletal: He exhibits edema. He exhibits no tenderness.  There is slight pretibial edema and erythema but much improved from the past when he had cellulitis here. The patient is very unstable with his gait has trouble getting out of the chair and up on the table without assistance. He did have a cane with him today.  Lymphadenopathy:    He has no cervical adenopathy.  Neurological: He is alert and oriented to person, place, and time. No cranial nerve deficit.  The patient's hearing is somewhat decreased.  Skin: Skin is warm and dry. Rash noted. There is erythema. No pallor.  The redness and rash are on his legs but definitely improved from the past and on a scale of 1-10 is probably about a 2.  Psychiatric: He has a normal mood and affect. His behavior is normal. Judgment and thought content normal.  Nursing note and vitals reviewed.  BP 135/79 mmHg  Pulse 61  Temp(Src) 96.2 F (35.7 C) (Oral)  Ht 5\' 5"  (1.651 m)  Wt 176 lb (79.833 kg)  BMI 29.29 kg/m2        Assessment & Plan:  1. Vitamin D deficiency -This will be further evaluated with the blood work today. Any change in treatment will be called to his sister. - Vit D  25 hydroxy (rtn osteoporosis monitoring)  2. Thrombocytopenia -The patient has had no problems with this and no signs of any rectal bleeding or blood in the urine. - CBC with Differential/Platelet  3. Paroxysmal atrial fibrillation -This is stable and he has no complaints of  shortness of breath or chest pain.  4. Essential hypertension -His blood pressure is good today he should continue with his lisinopril and metoprolol as he is currently doing. - BMP8+EGFR - Hepatic function panel  5. Hyperlipidemia -His cholesterol will be checked today and based on his previous lab work he should continue with the current treatment until the pending labs are returned - Lipid panel  6. Gait instability -He was reminded today to be careful and to use his walker or cane in the house at home and to use his alert button in case of any falls  No orders of the defined types were placed in this encounter.   Patient Instructions                       Medicare Annual Wellness Visit  [facility] and the medical providers at [facility] strive to bring you the  best medical care.  In doing so we not only want to address your current medical conditions and concerns but also to detect new conditions early and prevent illness, disease and health-related problems.    Medicare offers a yearly Wellness Visit which allows our clinical staff to assess your need for preventative services including immunizations, lifestyle education, counseling to decrease risk of preventable diseases and screening for fall risk and other medical concerns.    This visit is provided free of charge (no copay) for all Medicare recipients. The clinical pharmacists at [facility] have begun to conduct these Wellness Visits which will also include a thorough review of all your medications.    As you primary medical provider recommend that you make an appointment for your Annual Wellness Visit if you have not done so already this year.  You may set up this appointment before you leave today or you may call back ([phone]) and schedule an appointment.  Please make sure when you call that you mention that you are scheduling your Annual Wellness Visit with the clinical  pharmacist so that the appointment may be made for the proper length of time.     Continue current medications. Continue good therapeutic lifestyle changes which include good diet and exercise. Fall precautions discussed with patient. If an FOBT was given today- please return it to our front desk. If you are over 79 years old - you may need Prevnar 5 or the adult Pneumonia vaccine.  Flu Shots are still available at our office. If you still haven't had one please call to set up a nurse visit to get one.   After your visit with [place] today you will receive a survey in the mail or online from [employer] regarding your care with [place]. Please take a moment to fill this out. Your feedback is very important to [place] as you can help [place] better understand your patient needs as well as improve your experience and satisfaction. WE CARE ABOUT YOU!!!   Continue to try to drink as many fluids as possible Continue to be careful and do not put yourself at risk for falling Use a cane or your walker If you fall use your Lifeline   Arrie Senate MD

## 2015-02-05 NOTE — Patient Instructions (Addendum)
Medicare Annual Wellness Visit  [facility] and the medical providers at [facility] strive to bring you the best medical care.  In doing so we not only want to address your current medical conditions and concerns but also to detect new conditions early and prevent illness, disease and health-related problems.    Medicare offers a yearly Wellness Visit which allows our clinical staff to assess your need for preventative services including immunizations, lifestyle education, counseling to decrease risk of preventable diseases and screening for fall risk and other medical concerns.    This visit is provided free of charge (no copay) for all Medicare recipients. The clinical pharmacists at [facility] have begun to conduct these Wellness Visits which will also include a thorough review of all your medications.    As you primary medical provider recommend that you make an appointment for your Annual Wellness Visit if you have not done so already this year.  You may set up this appointment before you leave today or you may call back ([phone]) and schedule an appointment.  Please make sure when you call that you mention that you are scheduling your Annual Wellness Visit with the clinical pharmacist so that the appointment may be made for the proper length of time.     Continue current medications. Continue good therapeutic lifestyle changes which include good diet and exercise. Fall precautions discussed with patient. If an FOBT was given today- please return it to our front desk. If you are over 76 years old - you may need Prevnar 72 or the adult Pneumonia vaccine.  Flu Shots are still available at our office. If you still haven't had one please call to set up a nurse visit to get one.   After your visit with [place] today you will receive a survey in the mail or online from [employer] regarding your care with [place]. Please take a moment to  fill this out. Your feedback is very important to [place] as you can help [place] better understand your patient needs as well as improve your experience and satisfaction. WE CARE ABOUT YOU!!!   Continue to try to drink as many fluids as possible Continue to be careful and do not put yourself at risk for falling Use a cane or your walker If you fall use your Lifeline

## 2015-02-06 LAB — CBC WITH DIFFERENTIAL/PLATELET
Basophils Absolute: 0 x10E3/uL (ref 0.0–0.2)
Basos: 1 %
Eos: 6 %
Eosinophils Absolute: 0.4 x10E3/uL (ref 0.0–0.4)
HCT: 37.2 % — ABNORMAL LOW (ref 37.5–51.0)
Hemoglobin: 12 g/dL — ABNORMAL LOW (ref 12.6–17.7)
Immature Grans (Abs): 0 x10E3/uL (ref 0.0–0.1)
Immature Granulocytes: 0 %
Lymphocytes Absolute: 2 x10E3/uL (ref 0.7–3.1)
Lymphs: 30 %
MCH: 28.4 pg (ref 26.6–33.0)
MCHC: 32.3 g/dL (ref 31.5–35.7)
MCV: 88 fL (ref 79–97)
Monocytes Absolute: 0.7 x10E3/uL (ref 0.1–0.9)
Monocytes: 10 %
Neutrophils Absolute: 3.6 x10E3/uL (ref 1.4–7.0)
Neutrophils Relative %: 53 %
Platelets: 146 x10E3/uL — ABNORMAL LOW (ref 150–379)
RBC: 4.22 x10E6/uL (ref 4.14–5.80)
RDW: 14.1 % (ref 12.3–15.4)
WBC: 6.7 x10E3/uL (ref 3.4–10.8)

## 2015-02-06 LAB — BMP8+EGFR
BUN/Creatinine Ratio: 19 (ref 10–22)
BUN: 21 mg/dL (ref 10–36)
CO2: 27 mmol/L (ref 18–29)
Calcium: 9.2 mg/dL (ref 8.6–10.2)
Chloride: 101 mmol/L (ref 97–108)
Creatinine, Ser: 1.11 mg/dL (ref 0.76–1.27)
GFR calc Af Amer: 64 mL/min/1.73
GFR calc non Af Amer: 55 mL/min/1.73 — ABNORMAL LOW
Glucose: 102 mg/dL — ABNORMAL HIGH (ref 65–99)
Potassium: 4.5 mmol/L (ref 3.5–5.2)
Sodium: 144 mmol/L (ref 134–144)

## 2015-02-06 LAB — HEPATIC FUNCTION PANEL
ALT: 9 IU/L (ref 0–44)
AST: 19 IU/L (ref 0–40)
Albumin: 4.2 g/dL (ref 3.2–4.6)
Alkaline Phosphatase: 59 IU/L (ref 39–117)
Bilirubin Total: 0.4 mg/dL (ref 0.0–1.2)
Bilirubin, Direct: 0.13 mg/dL (ref 0.00–0.40)
Total Protein: 6.8 g/dL (ref 6.0–8.5)

## 2015-02-06 LAB — LIPID PANEL
Chol/HDL Ratio: 2.4 ratio (ref 0.0–5.0)
Cholesterol, Total: 115 mg/dL (ref 100–199)
HDL: 47 mg/dL
LDL Calculated: 51 mg/dL (ref 0–99)
Triglycerides: 83 mg/dL (ref 0–149)
VLDL Cholesterol Cal: 17 mg/dL (ref 5–40)

## 2015-02-06 LAB — VITAMIN D 25 HYDROXY (VIT D DEFICIENCY, FRACTURES): Vit D, 25-Hydroxy: 45.7 ng/mL (ref 30.0–100.0)

## 2015-02-09 ENCOUNTER — Other Ambulatory Visit: Payer: Self-pay | Admitting: Family Medicine

## 2015-02-10 ENCOUNTER — Encounter: Payer: Self-pay | Admitting: Family Medicine

## 2015-02-13 ENCOUNTER — Other Ambulatory Visit: Payer: Self-pay | Admitting: Family Medicine

## 2015-03-09 ENCOUNTER — Other Ambulatory Visit: Payer: Self-pay | Admitting: Family Medicine

## 2015-03-24 ENCOUNTER — Other Ambulatory Visit: Payer: Self-pay | Admitting: Family Medicine

## 2015-03-26 DIAGNOSIS — I70203 Unspecified atherosclerosis of native arteries of extremities, bilateral legs: Secondary | ICD-10-CM | POA: Diagnosis not present

## 2015-03-26 DIAGNOSIS — B351 Tinea unguium: Secondary | ICD-10-CM | POA: Diagnosis not present

## 2015-03-26 DIAGNOSIS — L84 Corns and callosities: Secondary | ICD-10-CM | POA: Diagnosis not present

## 2015-03-31 ENCOUNTER — Other Ambulatory Visit: Payer: Self-pay | Admitting: Family Medicine

## 2015-04-06 ENCOUNTER — Other Ambulatory Visit: Payer: Self-pay | Admitting: Family Medicine

## 2015-04-06 NOTE — Telephone Encounter (Signed)
Next appt set for 05/11/15

## 2015-05-11 ENCOUNTER — Encounter: Payer: Self-pay | Admitting: Family Medicine

## 2015-05-11 ENCOUNTER — Ambulatory Visit (INDEPENDENT_AMBULATORY_CARE_PROVIDER_SITE_OTHER): Payer: Medicare Other | Admitting: Family Medicine

## 2015-05-11 VITALS — BP 146/76 | HR 105 | Temp 97.7°F | Ht 65.0 in | Wt 172.0 lb

## 2015-05-11 DIAGNOSIS — I8311 Varicose veins of right lower extremity with inflammation: Secondary | ICD-10-CM

## 2015-05-11 DIAGNOSIS — I480 Paroxysmal atrial fibrillation: Secondary | ICD-10-CM | POA: Diagnosis not present

## 2015-05-11 DIAGNOSIS — I8312 Varicose veins of left lower extremity with inflammation: Secondary | ICD-10-CM | POA: Diagnosis not present

## 2015-05-11 DIAGNOSIS — R54 Age-related physical debility: Secondary | ICD-10-CM

## 2015-05-11 DIAGNOSIS — E785 Hyperlipidemia, unspecified: Secondary | ICD-10-CM | POA: Diagnosis not present

## 2015-05-11 DIAGNOSIS — D696 Thrombocytopenia, unspecified: Secondary | ICD-10-CM

## 2015-05-11 DIAGNOSIS — E559 Vitamin D deficiency, unspecified: Secondary | ICD-10-CM | POA: Diagnosis not present

## 2015-05-11 DIAGNOSIS — I10 Essential (primary) hypertension: Secondary | ICD-10-CM

## 2015-05-11 DIAGNOSIS — I872 Venous insufficiency (chronic) (peripheral): Secondary | ICD-10-CM

## 2015-05-11 LAB — POCT CBC
Granulocyte percent: 59.2 %G (ref 37–80)
HCT, POC: 37.2 % — AB (ref 43.5–53.7)
Hemoglobin: 11.9 g/dL — AB (ref 14.1–18.1)
LYMPH, POC: 2.6 (ref 0.6–3.4)
MCH, POC: 27.6 pg (ref 27–31.2)
MCHC: 31.9 g/dL (ref 31.8–35.4)
MCV: 86.5 fL (ref 80–97)
MPV: 7.8 fL (ref 0–99.8)
PLATELET COUNT, POC: 146 10*3/uL (ref 142–424)
POC Granulocyte: 4.8 (ref 2–6.9)
POC LYMPH %: 31.8 % (ref 10–50)
RBC: 4.3 M/uL — AB (ref 4.69–6.13)
RDW, POC: 15.6 %
WBC: 8.1 10*3/uL (ref 4.6–10.2)

## 2015-05-11 NOTE — Progress Notes (Signed)
Subjective:    Patient ID: [name] [name], male    DOB: 01-Feb-1915, 81 y.o.   MRN: [id number]  HPI Pt here for follow up and management of chronic medical problems which includes hypertension, hyperlipidemia, and atrial fibrillation. He is taking medications regularly. As usual, the patient has no complaints today. He is not needing refills on and this medicines. He will be given an FOBT to return and we'll get routine lab work done today. He denies chest pain, shortness of breath, GI problems or problems passing his water. The patient comes to the visit today with his sister. He does wear a Lifeline. He seems to forget that he has this. She says that he fell recently in his house but was able to get himself up out of the floor without call anyone. He responds to questions appropriately but repeats himself and tell me certain stories. He is doing extremely well considering he lives by himself at home I'm not sure how long this is going to last.       Patient Active Problem List   Diagnosis Date Noted  . Gait instability 02/05/2015  . Platelets decreased 09/30/2014  . 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 Thrombocytopenia 09/30/2014  . At high risk for falls 08/08/2014  . Frailty 05/21/2014  . HYPERLIPIDEMIA-MIXED 03/19/2009  . CAD, UNSPECIFIED SITE 03/19/2009  . ATRIAL FIBRILLATION 03/19/2009  . EDEMA 03/19/2009  . BENIGN PROSTATIC HYPERTROPHY, HX OF 03/19/2009   Outpatient Encounter Prescriptions as of 05/11/2015  Medication Sig  . aspirin EC 81 MG tablet Take 1 tablet (81 mg total) by mouth daily.  . Calcium Carbonate-Vitamin D 600-400 MG-UNIT per tablet Take 1 tablet by mouth daily.    Marland Kitchen desoximetasone (TOPICORT) 0.05 % cream Apply topically 2 (two) times daily.  Marland Kitchen docusate sodium (COLACE) 100 MG capsule Take 100 mg by mouth as needed.    . Fluocinonide Emulsified Base 0.05 % CREA Apply 1 application topically 2 (two) times daily.  . furosemide (LASIX) 40 MG tablet Take 1 tablet (40 mg  total) by mouth daily.  . furosemide (LASIX) 40 MG tablet TAKE 1 TABLET ONCE DAILY AS NEEDED FOR SEVERE SWELLING  . isosorbide dinitrate (ISORDIL) 10 MG tablet TAKE (1) TABLET TWICE A DAY.  Marland Kitchen lisinopril (PRINIVIL,ZESTRIL) 10 MG tablet TAKE 1 TABLET ONCE A DAY  . metoprolol (LOPRESSOR) 50 MG tablet TAKE (1) TABLET TWICE A DAY.  Marland Kitchen Omega-3 Fatty Acids (FISH OIL) 1000 MG CAPS Take 3 capsules by mouth daily. 1000 mg?   . omeprazole (PRILOSEC) 20 MG capsule TAKE (1) CAPSULE DAILY  . senna (SENOKOT) 8.6 MG tablet Take 1 tablet by mouth daily. prn  . simvastatin (ZOCOR) 40 MG tablet TAKE 1 TABLET IN THE EVENING FOR CHOLESTEROL  . triamcinolone cream (KENALOG) 0.1 % Pharmacy to mix 120g of trimacinolone 1% with 120g of eucerin Apply to legs BID   No facility-administered encounter medications on file as of 05/11/2015.     Review of Systems  Constitutional: Negative.   HENT: Negative.   Eyes: Negative.   Respiratory: Negative.   Cardiovascular: Negative.   Gastrointestinal: Negative.   Endocrine: Negative.   Genitourinary: Negative.   Musculoskeletal: Negative.   Skin: Negative.   Allergic/Immunologic: Negative.   Neurological: Negative.   Hematological: Negative.   Psychiatric/Behavioral: Negative.        Objective:   Physical Exam  Constitutional: He is oriented to person, place, and time. He appears well-developed and well-nourished. No distress.  The patient physically looks  good for his age of 38 years, he is beginning to fail some because of his memory.  HENT:  Head: Normocephalic and atraumatic.  Right Ear: External ear normal.  Left Ear: External ear normal.  Nose: Nose normal.  Mouth/Throat: Oropharynx is clear and moist. No oropharyngeal exudate.  Eyes: Conjunctivae and EOM are normal. Pupils are equal, round, and reactive to light. Right eye exhibits no discharge. Left eye exhibits no discharge. No scleral icterus.  Neck: Normal range of motion. Neck supple. No thyromegaly  present.  Neck was supple without adenopathy or thyroid  Cardiovascular: Normal rate and normal heart sounds.   No murmur heard. The heart remains irregular irregular at about 60/m  Pulmonary/Chest: Effort normal and breath sounds normal. No respiratory distress. He has no wheezes. He has no rales. He exhibits no tenderness.  Abdominal: Soft. Bowel sounds are normal. He exhibits no mass. There is no tenderness. There is no rebound and no guarding.  No abdominal tenderness  Musculoskeletal: He exhibits edema. He exhibits no tenderness.  The patient is somewhat weak on his legs and uses a cane for ambulation  Lymphadenopathy:    He has no cervical adenopathy.  Neurological: He is alert and oriented to person, place, and time. He has normal reflexes. No cranial nerve deficit.  Skin: Skin is warm and dry. Rash noted. There is erythema. No pallor.  He has some stasis dermatitis in both legs but is improved from the past.  Psychiatric: He has a normal mood and affect. His behavior is normal.  Nursing note and vitals reviewed.  BP 146/76 mmHg  Pulse 105  Temp(Src) 97.7 F (36.5 C) (Oral)  Ht _0  (1.651 m)  Wt 172 lb (78.019 kg)  BMI 28.62 kg/m2        Assessment & Plan:  1. Thrombocytopenia -The patient is having no bleeding issues and this will be monitored again today. - POCT CBC  2. Vitamin D deficiency -Currently the patient is not taking vitamin D according to his computer record. We will decide when the lab work returns if we need to start any. - POCT CBC - Vit D  25 hydroxy (rtn osteoporosis monitoring)  3. Paroxysmal atrial fibrillation -The patient is only taking a baby aspirin at this time. - POCT CBC  4. Hyperlipidemia -Keep taking current treatment pending results of lab work - POCT CBC - Lipid panel  5. Essential hypertension -The blood pressure is slightly elevated today but we will make no changes in this. - POCT CBC - BMP8+EGFR - Hepatic function  panel  6. Frailty -The patient insists on staying at home with only periodic checks by his sister. He has refused going to assisted living on multiple occasions - POCT CBC - BMP8+EGFR - Hepatic function panel  7. Venous stasis dermatitis of both lower extremities -This is mild today and no treatment will be given today  Patient Instructions                       Medicare Annual Wellness Visit  [facility] and the medical providers at [facility] strive to bring you the best medical care.  In doing so we not only want to address your current medical conditions and concerns but also to detect new conditions early and prevent illness, disease and health-related problems.    Medicare offers a yearly Wellness Visit which allows our clinical staff to assess your need for preventative services including immunizations, lifestyle education,  counseling to decrease risk of preventable diseases and screening for fall risk and other medical concerns.    This visit is provided free of charge (no copay) for all Medicare recipients. The clinical pharmacists at [facility] have begun to conduct these Wellness Visits which will also include a thorough review of all your medications.    As you primary medical provider recommend that you make an appointment for your Annual Wellness Visit if you have not done so already this year.  You may set up this appointment before you leave today or you may call back ([phone]) and schedule an appointment.  Please make sure when you call that you mention that you are scheduling your Annual Wellness Visit with the clinical pharmacist so that the appointment may be made for the proper length of time.     Continue current medications. Continue good therapeutic lifestyle changes which include good diet and exercise. Fall precautions discussed with patient. If an FOBT was given today- please return it to our front desk. If you are  over 52 years old - you may need Prevnar 74 or the adult Pneumonia vaccine.  Flu Shots are still available at our office. If you still haven't had one please call to set up a nurse visit to get one.   After your visit with [place] today you will receive a survey in the mail or online from [employer] regarding your care with [place]. Please take a moment to fill this out. Your feedback is very important to [place] as you can help [place] better understand your patient needs as well as improve your experience and satisfaction. WE CARE ABOUT YOU!!!   Continue to be careful and do not put yourself at risk for falling Use your Lifeline to call for help Continue to drink plenty of fluids   Arrie Senate MD

## 2015-05-11 NOTE — Patient Instructions (Addendum)
Medicare Annual Wellness Visit  [facility] and the medical providers at [facility] strive to bring you the best medical care.  In doing so we not only want to address your current medical conditions and concerns but also to detect new conditions early and prevent illness, disease and health-related problems.    Medicare offers a yearly Wellness Visit which allows our clinical staff to assess your need for preventative services including immunizations, lifestyle education, counseling to decrease risk of preventable diseases and screening for fall risk and other medical concerns.    This visit is provided free of charge (no copay) for all Medicare recipients. The clinical pharmacists at [facility] have begun to conduct these Wellness Visits which will also include a thorough review of all your medications.    As you primary medical provider recommend that you make an appointment for your Annual Wellness Visit if you have not done so already this year.  You may set up this appointment before you leave today or you may call back ([phone]) and schedule an appointment.  Please make sure when you call that you mention that you are scheduling your Annual Wellness Visit with the clinical pharmacist so that the appointment may be made for the proper length of time.     Continue current medications. Continue good therapeutic lifestyle changes which include good diet and exercise. Fall precautions discussed with patient. If an FOBT was given today- please return it to our front desk. If you are over 61 years old - you may need Prevnar 1 or the adult Pneumonia vaccine.  Flu Shots are still available at our office. If you still haven't had one please call to set up a nurse visit to get one.   After your visit with [place] today you will receive a survey in the mail or online from [employer] regarding your care with [place]. Please take a moment to  fill this out. Your feedback is very important to [place] as you can help [place] better understand your patient needs as well as improve your experience and satisfaction. WE CARE ABOUT YOU!!!   Continue to be careful and do not put yourself at risk for falling Use your Lifeline to call for help Continue to drink plenty of fluids

## 2015-05-12 LAB — LIPID PANEL
Chol/HDL Ratio: 2.6 ratio units (ref 0.0–5.0)
Cholesterol, Total: 125 mg/dL (ref 100–199)
HDL: 49 mg/dL (ref >39)
LDL Calculated: 58 mg/dL (ref 0–99)
TRIGLYCERIDES: 92 mg/dL (ref 0–149)
VLDL Cholesterol Cal: 18 mg/dL (ref 5–40)

## 2015-05-12 LAB — BMP8+EGFR
BUN/Creatinine Ratio: 19 (ref 10–22)
BUN: 21 mg/dL (ref 10–36)
CHLORIDE: 101 mmol/L (ref 97–108)
CO2: 28 mmol/L (ref 18–29)
Calcium: 9.1 mg/dL (ref 8.6–10.2)
Creatinine, Ser: 1.13 mg/dL (ref 0.76–1.27)
GFR, EST AFRICAN AMERICAN: 63 mL/min/{1.73_m2} (ref >59)
GFR, EST NON AFRICAN AMERICAN: 54 mL/min/{1.73_m2} — AB (ref >59)
Glucose: 96 mg/dL (ref 65–99)
Potassium: 4.8 mmol/L (ref 3.5–5.2)
SODIUM: 145 mmol/L — AB (ref 134–144)

## 2015-05-12 LAB — HEPATIC FUNCTION PANEL
ALBUMIN: 4 g/dL (ref 3.2–4.6)
ALT: 14 IU/L (ref 0–44)
AST: 17 IU/L (ref 0–40)
Alkaline Phosphatase: 54 IU/L (ref 39–117)
BILIRUBIN, DIRECT: 0.11 mg/dL (ref 0.00–0.40)
Bilirubin Total: 0.3 mg/dL (ref 0.0–1.2)
Total Protein: 6.3 g/dL (ref 6.0–8.5)

## 2015-05-12 LAB — VITAMIN D 25 HYDROXY (VIT D DEFICIENCY, FRACTURES): Vit D, 25-Hydroxy: 36.3 ng/mL (ref 30.0–100.0)

## 2015-06-08 ENCOUNTER — Other Ambulatory Visit: Payer: Self-pay | Admitting: Family Medicine

## 2015-07-13 ENCOUNTER — Other Ambulatory Visit: Payer: Self-pay | Admitting: Pharmacist

## 2015-07-21 ENCOUNTER — Other Ambulatory Visit: Payer: Self-pay | Admitting: Family Medicine

## 2015-08-10 ENCOUNTER — Other Ambulatory Visit: Payer: Self-pay | Admitting: Family Medicine

## 2015-08-31 ENCOUNTER — Other Ambulatory Visit: Payer: Self-pay | Admitting: Family Medicine

## 2015-09-14 ENCOUNTER — Ambulatory Visit (INDEPENDENT_AMBULATORY_CARE_PROVIDER_SITE_OTHER): Payer: Medicare Other | Admitting: Family Medicine

## 2015-09-14 ENCOUNTER — Ambulatory Visit (INDEPENDENT_AMBULATORY_CARE_PROVIDER_SITE_OTHER): Payer: Medicare Other

## 2015-09-14 ENCOUNTER — Encounter: Payer: Self-pay | Admitting: Family Medicine

## 2015-09-14 VITALS — BP 139/69 | HR 74 | Temp 97.0°F | Ht 65.0 in | Wt 170.0 lb

## 2015-09-14 DIAGNOSIS — I10 Essential (primary) hypertension: Secondary | ICD-10-CM | POA: Diagnosis not present

## 2015-09-14 DIAGNOSIS — Z23 Encounter for immunization: Secondary | ICD-10-CM | POA: Diagnosis not present

## 2015-09-14 DIAGNOSIS — I8312 Varicose veins of left lower extremity with inflammation: Secondary | ICD-10-CM | POA: Diagnosis not present

## 2015-09-14 DIAGNOSIS — R54 Age-related physical debility: Secondary | ICD-10-CM

## 2015-09-14 DIAGNOSIS — I8311 Varicose veins of right lower extremity with inflammation: Secondary | ICD-10-CM | POA: Diagnosis not present

## 2015-09-14 DIAGNOSIS — E785 Hyperlipidemia, unspecified: Secondary | ICD-10-CM | POA: Diagnosis not present

## 2015-09-14 DIAGNOSIS — E559 Vitamin D deficiency, unspecified: Secondary | ICD-10-CM | POA: Diagnosis not present

## 2015-09-14 DIAGNOSIS — I872 Venous insufficiency (chronic) (peripheral): Secondary | ICD-10-CM

## 2015-09-14 DIAGNOSIS — I480 Paroxysmal atrial fibrillation: Secondary | ICD-10-CM

## 2015-09-14 DIAGNOSIS — D696 Thrombocytopenia, unspecified: Secondary | ICD-10-CM

## 2015-09-14 NOTE — Patient Instructions (Addendum)
Medicare Annual Wellness Visit  [facility] and the medical providers at [facility] strive to bring you the best medical care.  In doing so we not only want to address your current medical conditions and concerns but also to detect new conditions early and prevent illness, disease and health-related problems.    Medicare offers a yearly Wellness Visit which allows our clinical staff to assess your need for preventative services including immunizations, lifestyle education, counseling to decrease risk of preventable diseases and screening for fall risk and other medical concerns.    This visit is provided free of charge (no copay) for all Medicare recipients. The clinical pharmacists at [facility] have begun to conduct these Wellness Visits which will also include a thorough review of all your medications.    As you primary medical provider recommend that you make an appointment for your Annual Wellness Visit if you have not done so already this year.  You may set up this appointment before you leave today or you may call back ([phone]) and schedule an appointment.  Please make sure when you call that you mention that you are scheduling your Annual Wellness Visit with the clinical pharmacist so that the appointment may be made for the proper length of time.     Continue current medications. Continue good therapeutic lifestyle changes which include good diet and exercise. Fall precautions discussed with patient. If an FOBT was given today- please return it to our front desk. If you are over 41 years old - you may need Prevnar 47 or the adult Pneumonia vaccine.  **Flu shots will be available soon--- please call and schedule a FLU-CLINIC appointment**  After your visit with [place] today you will receive a survey in the mail or online from [employer] regarding your care with [place]. Please take a moment to fill this out. Your feedback is  very important to [place] as you can help [place] better understand your patient needs as well as improve your experience and satisfaction. WE CARE ABOUT YOU!!!   **Please join [place] SEPT.22, 2016 from 5:00 to 7:00pm for our OPEN HOUSE! Come out and meet our NEW providers** The patient should continue to be careful and not putting himself at risk for falling He should continue to have daily supervision by his sister and niece He should stay well hydrated and drink plenty of fluids

## 2015-09-14 NOTE — Progress Notes (Signed)
Subjective:    Patient ID: [name] [name], male    DOB: 1915-06-29, 81 y.o.   MRN: [id number]  HPI  Pt here for follow up and management of chronic medical problems which includes hyperlipidemia and atrial fibrillation. He is taking medications regularly. The patient comes to the visit today with his sister. He still lives by himself at home and someone checks on him daily to make sure that he has food to eat he uses the microwave for heating up his food. His memory is failing greatly. He responds appropriately to questions asked of him. He denies any chest pain or shortness of breath or problems with his GI tract or voiding issues. He does complain of an area below his umbilicus that he has had for years as if he did not realize he had had this which is an area of contact dermatitis most likely from his belt vocal. He still has some problems with the stasis dermatitis in his legs but these are improved from the past.       Patient Active Problem List   Diagnosis Date Noted  . Gait instability 02/05/2015  . Platelets decreased 09/30/2014  . 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 Thrombocytopenia 09/30/2014  . At high risk for falls 08/08/2014  . Frailty 05/21/2014  . HYPERLIPIDEMIA-MIXED 03/19/2009  . CAD, UNSPECIFIED SITE 03/19/2009  . ATRIAL FIBRILLATION 03/19/2009  . EDEMA 03/19/2009  . BENIGN PROSTATIC HYPERTROPHY, HX OF 03/19/2009   Outpatient Encounter Prescriptions as of 09/14/2015  Medication Sig  . ASPIRIN EC LO-DOSE 81 MG EC tablet TAKE 1 TABLET DAILY  . Calcium Carbonate-Vitamin D 600-400 MG-UNIT per tablet Take 1 tablet by mouth daily.    Marland Kitchen desoximetasone (TOPICORT) 0.05 % cream Apply topically 2 (two) times daily.  Marland Kitchen docusate sodium (COLACE) 100 MG capsule Take 100 mg by mouth as needed.    . Fluocinonide Emulsified Base 0.05 % CREA Apply 1 application topically 2 (two) times daily.  . furosemide (LASIX) 40 MG tablet Take 1 tablet (40 mg total) by mouth daily.  . isosorbide  dinitrate (ISORDIL) 10 MG tablet TAKE (1) TABLET TWICE A DAY.  Marland Kitchen lisinopril (PRINIVIL,ZESTRIL) 10 MG tablet TAKE 1 TABLET ONCE A DAY  . metoprolol (LOPRESSOR) 50 MG tablet TAKE (1) TABLET TWICE A DAY.  Marland Kitchen Omega-3 Fatty Acids (FISH OIL) 1000 MG CAPS Take 3 capsules by mouth daily. 1000 mg?   . omeprazole (PRILOSEC) 20 MG capsule TAKE (1) CAPSULE DAILY  . senna (SENOKOT) 8.6 MG tablet Take 1 tablet by mouth daily. prn  . simvastatin (ZOCOR) 40 MG tablet TAKE 1 TABLET IN THE EVENING FOR CHOLESTEROL  . triamcinolone cream (KENALOG) 0.1 % Pharmacy to mix 120g of trimacinolone 1% with 120g of eucerin Apply to legs BID  . [DISCONTINUED] furosemide (LASIX) 40 MG tablet TAKE 1 TABLET ONCE DAILY AS NEEDED FOR SEVERE SWELLING   No facility-administered encounter medications on file as of 09/14/2015.     Review of Systems  Constitutional: Negative.   HENT: Negative.   Eyes: Negative.   Respiratory: Negative.   Cardiovascular: Negative.   Gastrointestinal: Negative.   Endocrine: Negative.   Genitourinary: Negative.   Musculoskeletal: Negative.   Skin: Negative.        Bilateral lower legs - redness  Allergic/Immunologic: Negative.   Neurological: Negative.   Hematological: Negative.   Psychiatric/Behavioral: Negative.        Objective:   Physical Exam  Constitutional: No distress.  Elderly and frail and with declining memory  HENT:  Head: Normocephalic and atraumatic.  Right Ear: External ear normal.  Left Ear: External ear normal.  Nose: Nose normal.  Mouth/Throat: Oropharynx is clear and moist. No oropharyngeal exudate.  Eyes: Conjunctivae and EOM are normal. Pupils are equal, round, and reactive to light. Right eye exhibits no discharge. Left eye exhibits no discharge. No scleral icterus.  Neck: Normal range of motion. Neck supple. No thyromegaly present.  Cardiovascular: Normal rate and normal heart sounds.   No murmur heard. The heart is slightly irregular today at 72/m    Pulmonary/Chest: Effort normal and breath sounds normal. No respiratory distress. He has no wheezes. He has no rales. He exhibits no tenderness.  Abdominal: Soft. Bowel sounds are normal. He exhibits no mass. There is no tenderness. There is no rebound and no guarding.  Musculoskeletal: He exhibits edema (there is minimal edema with redness bilaterally). He exhibits no tenderness.  The patient has problems with walking and is very stiff and has to use a cane for ambulating  Lymphadenopathy:    He has no cervical adenopathy.  Neurological: He is alert.  The patient's memory as mentioned earlier is declining significantly.  Skin: Skin is warm and dry. Rash noted. There is erythema. No pallor.  There is redness and minimal swelling in both lower extremities secondary to venous stasis dermatitis  Psychiatric: He has a normal mood and affect. His behavior is normal.  Nursing note and vitals reviewed.  BP 139/69 mmHg  Pulse 74  Temp(Src) 97 F (36.1 C) (Oral)  Ht $R'5\' 5"'QI$  (1.651 m)  Wt 170 lb (77.111 kg)  BMI 28.29 kg/m2        Assessment & Plan:  1. Thrombocytopenia -The patient is having no bleeding issues that we are aware of. - CBC with Differential/Platelet  2. Vitamin D deficiency -He should continue taking his calcium and vitamin D - CBC with Differential/Platelet - Vit D  25 hydroxy (rtn osteoporosis monitoring)  3. Paroxysmal atrial fibrillation -The rhythm is slightly irregular today at 72/m. He should continue to take his low-dose baby aspirin. - CBC with Differential/Platelet  4. Essential hypertension -The blood pressure is good today he should continue with current treatment - BMP8+EGFR - Hepatic function panel - CBC with Differential/Platelet - DG Chest 2 View; Future  5. Hyperlipidemia -He should continue with his simvastatin pending results of lab work - CBC with Differential/Platelet - Lipid panel - DG Chest 2 View; Future  6. Frailty -He remains frail  and fragile and has an unstable gait. - CBC with Differential/Platelet - DG Chest 2 View; Future  7. Venous stasis dermatitis of both lower extremities -There remains to slight redness and erythema of the legs and we will make no changes in treatment. - CBC with Differential/Platelet  Patient Instructions                       Medicare Annual Wellness Visit  [facility] and the medical providers at [facility] strive to bring you the best medical care.  In doing so we not only want to address your current medical conditions and concerns but also to detect new conditions early and prevent illness, disease and health-related problems.    Medicare offers a yearly Wellness Visit which allows our clinical staff to assess your need for preventative services including immunizations, lifestyle education, counseling to decrease risk of preventable diseases and screening for fall risk and other medical concerns.    This visit is  provided free of charge (no copay) for all Medicare recipients. The clinical pharmacists at [facility] have begun to conduct these Wellness Visits which will also include a thorough review of all your medications.    As you primary medical provider recommend that you make an appointment for your Annual Wellness Visit if you have not done so already this year.  You may set up this appointment before you leave today or you may call back ([phone]) and schedule an appointment.  Please make sure when you call that you mention that you are scheduling your Annual Wellness Visit with the clinical pharmacist so that the appointment may be made for the proper length of time.     Continue current medications. Continue good therapeutic lifestyle changes which include good diet and exercise. Fall precautions discussed with patient. If an FOBT was given today- please return it to our front desk. If you are over 12 years old - you may need  Prevnar 41 or the adult Pneumonia vaccine.  **Flu shots will be available soon--- please call and schedule a FLU-CLINIC appointment**  After your visit with [place] today you will receive a survey in the mail or online from [employer] regarding your care with [place]. Please take a moment to fill this out. Your feedback is very important to [place] as you can help [place] better understand your patient needs as well as improve your experience and satisfaction. WE CARE ABOUT YOU!!!   **Please join [place] SEPT.[email or web address], 2016 from 5:00 to 7:00pm for our OPEN HOUSE! Come out and meet our NEW providers** The patient should continue to be careful and not putting himself at risk for falling He should continue to have daily supervision by his sister and niece He should stay well hydrated and drink plenty of fluids   Arrie Senate MD

## 2015-09-15 LAB — BMP8+EGFR
BUN/Creatinine Ratio: 19 (ref 10–22)
BUN: 21 mg/dL (ref 10–36)
CO2: 28 mmol/L (ref 18–29)
CREATININE: 1.1 mg/dL (ref 0.76–1.27)
Calcium: 9.2 mg/dL (ref 8.6–10.2)
Chloride: 99 mmol/L (ref 97–108)
GFR, EST AFRICAN AMERICAN: 65 mL/min/{1.73_m2} (ref >59)
GFR, EST NON AFRICAN AMERICAN: 56 mL/min/{1.73_m2} — AB (ref >59)
Glucose: 105 mg/dL — ABNORMAL HIGH (ref 65–99)
Potassium: 4.5 mmol/L (ref 3.5–5.2)
SODIUM: 141 mmol/L (ref 134–144)

## 2015-09-15 LAB — CBC WITH DIFFERENTIAL/PLATELET
Basophils Absolute: 0.1 x10E3/uL (ref 0.0–0.2)
Basos: 1 %
EOS (ABSOLUTE): 0.4 x10E3/uL (ref 0.0–0.4)
Eos: 5 %
Hematocrit: 37.3 % — ABNORMAL LOW (ref 37.5–51.0)
Hemoglobin: 11.9 g/dL — ABNORMAL LOW (ref 12.6–17.7)
Immature Grans (Abs): 0 x10E3/uL (ref 0.0–0.1)
Immature Granulocytes: 0 %
Lymphocytes Absolute: 2.5 x10E3/uL (ref 0.7–3.1)
Lymphs: 30 %
MCH: 28.3 pg (ref 26.6–33.0)
MCHC: 31.9 g/dL (ref 31.5–35.7)
MCV: 89 fL (ref 79–97)
Monocytes Absolute: 0.9 x10E3/uL (ref 0.1–0.9)
Monocytes: 11 %
Neutrophils Absolute: 4.4 x10E3/uL (ref 1.4–7.0)
Neutrophils: 53 %
Platelets: 167 x10E3/uL (ref 150–379)
RBC: 4.2 x10E6/uL (ref 4.14–5.80)
RDW: 15.3 % (ref 12.3–15.4)
WBC: 8.3 x10E3/uL (ref 3.4–10.8)

## 2015-09-15 LAB — LIPID PANEL
Chol/HDL Ratio: 2.6 ratio (ref 0.0–5.0)
Cholesterol, Total: 122 mg/dL (ref 100–199)
HDL: 47 mg/dL (ref >39)
LDL Calculated: 59 mg/dL (ref 0–99)
Triglycerides: 82 mg/dL (ref 0–149)
VLDL Cholesterol Cal: 16 mg/dL (ref 5–40)

## 2015-09-15 LAB — HEPATIC FUNCTION PANEL
ALK PHOS: 61 IU/L (ref 39–117)
ALT: 12 IU/L (ref 0–44)
AST: 15 IU/L (ref 0–40)
Albumin: 4.3 g/dL (ref 3.2–4.6)
BILIRUBIN, DIRECT: 0.14 mg/dL (ref 0.00–0.40)
Bilirubin Total: 0.4 mg/dL (ref 0.0–1.2)
TOTAL PROTEIN: 6.7 g/dL (ref 6.0–8.5)

## 2015-09-15 LAB — VITAMIN D 25 HYDROXY (VIT D DEFICIENCY, FRACTURES): Vit D, 25-Hydroxy: 41.3 ng/mL (ref 30.0–100.0)

## 2015-09-16 ENCOUNTER — Telehealth: Payer: Self-pay

## 2015-09-16 NOTE — Telephone Encounter (Signed)
Pt would like [name] to call him

## 2015-09-16 NOTE — Telephone Encounter (Signed)
Patient just wanted to talk - he wasn't sure that it was me that came and spoke to him when he was here to see Dr Laurance Flatten.

## 2015-10-21 ENCOUNTER — Ambulatory Visit (INDEPENDENT_AMBULATORY_CARE_PROVIDER_SITE_OTHER): Payer: Medicare Other | Admitting: Family Medicine

## 2015-10-21 ENCOUNTER — Encounter: Payer: Self-pay | Admitting: Family Medicine

## 2015-10-21 VITALS — BP 149/80 | HR 86 | Temp 99.4°F | Ht 65.0 in | Wt 167.0 lb

## 2015-10-21 DIAGNOSIS — M542 Cervicalgia: Secondary | ICD-10-CM | POA: Diagnosis not present

## 2015-10-21 NOTE — Progress Notes (Signed)
Subjective:    Patient ID: [name] [name], male    DOB: December 20, 1914, 81 y.o.   MRN: [id number]  HPI 81 year old gentleman who is accompanied by his sister with 2 complaints today. First he has some soreness on the lateral aspect of his neck bilaterally. He denies any fall or injury that he remembers but he is not a very reliable historian. There is no radiation of pain into his arms any paresthesias numbness or weakness in the arms.  He also some complains of some swelling in his right foot and pain in his right great toe. I can there is no history of trauma but this is not apparently a new injury since he presents today wearing a shoe that has been cut out especially for him.  Patient Active Problem List   Diagnosis Date Noted  . Gait instability 02/05/2015  . Platelets decreased ([place]) 09/30/2014  . 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 Thrombocytopenia ([place]) 09/30/2014  . At high risk for falls 08/08/2014  . Frailty 05/21/2014  . HYPERLIPIDEMIA-MIXED 03/19/2009  . CAD, UNSPECIFIED SITE 03/19/2009  . ATRIAL FIBRILLATION 03/19/2009  . EDEMA 03/19/2009  . BENIGN PROSTATIC HYPERTROPHY, HX OF 03/19/2009   Outpatient Encounter Prescriptions as of 10/21/2015  Medication Sig  . ASPIRIN EC LO-DOSE 81 MG EC tablet TAKE 1 TABLET DAILY  . Calcium Carbonate-Vitamin D 600-400 MG-UNIT per tablet Take 1 tablet by mouth daily.    Marland Kitchen desoximetasone (TOPICORT) 0.05 % cream Apply topically 2 (two) times daily.  Marland Kitchen docusate sodium (COLACE) 100 MG capsule Take 100 mg by mouth as needed.    . Fluocinonide Emulsified Base 0.05 % CREA Apply 1 application topically 2 (two) times daily.  . furosemide (LASIX) 40 MG tablet Take 1 tablet (40 mg total) by mouth daily.  . isosorbide dinitrate (ISORDIL) 10 MG tablet TAKE (1) TABLET TWICE A DAY.  Marland Kitchen lisinopril (PRINIVIL,ZESTRIL) 10 MG tablet TAKE 1 TABLET ONCE A DAY  . metoprolol (LOPRESSOR) 50 MG tablet TAKE (1) TABLET TWICE A DAY.  Marland Kitchen Omega-3 Fatty Acids (FISH OIL)  1000 MG CAPS Take 3 capsules by mouth daily. 1000 mg?   . omeprazole (PRILOSEC) 20 MG capsule TAKE (1) CAPSULE DAILY  . senna (SENOKOT) 8.6 MG tablet Take 1 tablet by mouth daily. prn  . simvastatin (ZOCOR) 40 MG tablet TAKE 1 TABLET IN THE EVENING FOR CHOLESTEROL  . triamcinolone cream (KENALOG) 0.1 % Pharmacy to mix 120g of trimacinolone 1% with 120g of eucerin Apply to legs BID   No facility-administered encounter medications on file as of 10/21/2015.      Review of Systems  Musculoskeletal: Positive for neck pain.  Neurological: Positive for weakness.  Psychiatric/Behavioral: Positive for decreased concentration.       Objective:   Physical Exam  Neck: Normal range of motion. Neck supple.  There is tenderness on palpation of the trapezius muscle both sides. There is no tenderness on palpation of the cervical spine. Grip strength is normal in equal in right left hands.  Musculoskeletal:  The right ankle and foot are edematous with skin changes consistent with stasis disease. The great toenail is misshapen secondary to fungal infection and there is a dry scab on the outer aspect of the toenail. The toe is not tender to touch or palpate.          Assessment & Plan:  1. Neck pain The pain seems to be muscular. Sister has been using some horse liniment. I suggested she might try Biofreeze as  well as application of low-grade heat as with a heating pad or warm towels. If symptoms do not resolve consider physical therapy evaluation and treatment  Wardell Honour MD

## 2015-10-24 ENCOUNTER — Other Ambulatory Visit: Payer: Self-pay | Admitting: Family Medicine

## 2015-11-19 DIAGNOSIS — L84 Corns and callosities: Secondary | ICD-10-CM | POA: Diagnosis not present

## 2015-11-19 DIAGNOSIS — B351 Tinea unguium: Secondary | ICD-10-CM | POA: Diagnosis not present

## 2015-11-19 DIAGNOSIS — I70203 Unspecified atherosclerosis of native arteries of extremities, bilateral legs: Secondary | ICD-10-CM | POA: Diagnosis not present

## 2015-11-30 ENCOUNTER — Other Ambulatory Visit: Payer: Self-pay | Admitting: Family Medicine

## 2016-01-15 ENCOUNTER — Ambulatory Visit (INDEPENDENT_AMBULATORY_CARE_PROVIDER_SITE_OTHER): Payer: Medicare Other | Admitting: Family Medicine

## 2016-01-15 ENCOUNTER — Encounter: Payer: Self-pay | Admitting: Family Medicine

## 2016-01-15 VITALS — BP 153/70 | HR 71 | Temp 96.6°F | Ht 65.0 in | Wt 167.0 lb

## 2016-01-15 DIAGNOSIS — E559 Vitamin D deficiency, unspecified: Secondary | ICD-10-CM | POA: Diagnosis not present

## 2016-01-15 DIAGNOSIS — D696 Thrombocytopenia, unspecified: Secondary | ICD-10-CM

## 2016-01-15 DIAGNOSIS — I480 Paroxysmal atrial fibrillation: Secondary | ICD-10-CM

## 2016-01-15 DIAGNOSIS — R32 Unspecified urinary incontinence: Secondary | ICD-10-CM | POA: Diagnosis not present

## 2016-01-15 DIAGNOSIS — E785 Hyperlipidemia, unspecified: Secondary | ICD-10-CM

## 2016-01-15 DIAGNOSIS — I10 Essential (primary) hypertension: Secondary | ICD-10-CM

## 2016-01-15 DIAGNOSIS — R54 Age-related physical debility: Secondary | ICD-10-CM | POA: Diagnosis not present

## 2016-01-15 DIAGNOSIS — R413 Other amnesia: Secondary | ICD-10-CM

## 2016-01-15 DIAGNOSIS — R159 Full incontinence of feces: Secondary | ICD-10-CM

## 2016-01-15 NOTE — Progress Notes (Signed)
Subjective:    Patient ID: [name] [name], male    DOB: May 26, 1915, 82 y.o.   MRN: [id number]  HPI Pt here for follow up and management of chronic medical problems which includes hypertension and a fib. He is taking medications regularly. The patient is stable. He comes to the visit with his sister. He is having some bladder and bowel incontinence. He'll get traditional lab work today. The patient has no specific complaints. He stays in his house and does not go out the house during the day he does not drive anymore. His niece and sister check on him daily. The drugstore supplies his medicines and sometimes he does not take the medications that he is supposed to be taking. Under the circumstances things are stable considering his age no 1 with him during the day other than being checked on and he seems to be maintaining his weight well. There is no complaint of chest pain or shortness of breath. Is no complaint of any GI abnormalities. He does have his periodic incontinence with bowel and bladder but no specific complaints other than lack of control. He is wearing depends today.     Patient Active Problem List   Diagnosis Date Noted  . Gait instability 02/05/2015  . Platelets decreased ([place]) 09/30/2014  . 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 Thrombocytopenia ([place]) 09/30/2014  . At high risk for falls 08/08/2014  . Frailty 05/21/2014  . HYPERLIPIDEMIA-MIXED 03/19/2009  . CAD, UNSPECIFIED SITE 03/19/2009  . ATRIAL FIBRILLATION 03/19/2009  . EDEMA 03/19/2009  . BENIGN PROSTATIC HYPERTROPHY, HX OF 03/19/2009   Outpatient Encounter Prescriptions as of 01/15/2016  Medication Sig  . ASPIRIN EC LO-DOSE 81 MG EC tablet TAKE 1 TABLET DAILY  . Calcium Carbonate-Vitamin D 600-400 MG-UNIT per tablet Take 1 tablet by mouth daily.    Marland Kitchen desoximetasone (TOPICORT) 0.05 % cream Apply topically 2 (two) times daily.  Marland Kitchen docusate sodium (COLACE) 100 MG capsule Take 100 mg by mouth as needed.    .  Fluocinonide Emulsified Base 0.05 % CREA Apply 1 application topically 2 (two) times daily.  . furosemide (LASIX) 40 MG tablet TAKE 1 TABLET ONCE DAILY AS NEEDED FOR SEVERE SWELLING  . isosorbide dinitrate (ISORDIL) 10 MG tablet TAKE (1) TABLET TWICE A DAY.  Marland Kitchen lisinopril (PRINIVIL,ZESTRIL) 10 MG tablet TAKE 1 TABLET ONCE A DAY  . metoprolol (LOPRESSOR) 50 MG tablet TAKE (1) TABLET TWICE A DAY.  Marland Kitchen Omega-3 Fatty Acids (FISH OIL) 1000 MG CAPS Take 3 capsules by mouth daily. 1000 mg?   . omeprazole (PRILOSEC) 20 MG capsule TAKE (1) CAPSULE DAILY  . senna (SENOKOT) 8.6 MG tablet Take 1 tablet by mouth daily. prn  . simvastatin (ZOCOR) 40 MG tablet TAKE 1 TABLET IN THE EVENING FOR CHOLESTEROL  . triamcinolone cream (KENALOG) 0.1 % Pharmacy to mix 120g of trimacinolone 1% with 120g of eucerin Apply to legs BID  . [DISCONTINUED] furosemide (LASIX) 40 MG tablet Take 1 tablet (40 mg total) by mouth daily.  . [DISCONTINUED] furosemide (LASIX) 40 MG tablet TAKE 1 TABLET ONCE DAILY AS NEEDED FOR SEVERE SWELLING   No facility-administered encounter medications on file as of 01/15/2016.      Review of Systems  Constitutional: Negative.   HENT: Negative.   Eyes: Negative.   Respiratory: Negative.   Cardiovascular: Negative.   Gastrointestinal: Negative.   Endocrine: Negative.   Genitourinary: Negative.        Incontinence - bladder and bowels  Musculoskeletal: Negative.  Skin: Negative.   Allergic/Immunologic: Negative.   Neurological: Negative.   Hematological: Negative.   Psychiatric/Behavioral: Negative.        Objective:   Physical Exam  Constitutional: He is oriented to person, place, and time. He appears well-developed and well-nourished.  The patient appears to be well-nourished and well-developed despite his frailty in memory and gait  HENT:  Head: Normocephalic and atraumatic.  Right Ear: External ear normal.  Left Ear: External ear normal.  Nose: Nose normal.  Mouth/Throat:  Oropharynx is clear and moist. No oropharyngeal exudate.  Eyes: Conjunctivae and EOM are normal. Pupils are equal, round, and reactive to light. Right eye exhibits no discharge. Left eye exhibits no discharge. No scleral icterus.  Neck: Normal range of motion. Neck supple. No thyromegaly present.  Cardiovascular: Normal rate and normal heart sounds.   No murmur heard. Heart is irregular irregular at 72/m  Pulmonary/Chest: Effort normal and breath sounds normal. No respiratory distress. He has no wheezes. He has no rales. He exhibits no tenderness.  Lungs are clear anteriorly and posteriorly  Abdominal: Soft. Bowel sounds are normal. He exhibits no mass. There is no tenderness. There is no rebound and no guarding.  The abdomen is nontender and there is no enlargement of the spleen or liver.  Musculoskeletal: Normal range of motion. He exhibits edema (there is edema in the right ankle area and less on the left). He exhibits no tenderness.  The patient is somewhat unstable with walking and needs the cane for ambulation.  Lymphadenopathy:    He has no cervical adenopathy.  Neurological: He is alert and oriented to person, place, and time.  Skin: Skin is warm and dry. No rash noted.  Psychiatric: He has a normal mood and affect. His behavior is normal. Thought content normal.  Nursing note and vitals reviewed.   BP 153/70 mmHg  Pulse 71  Temp(Src) 96.6 F (35.9 C) (Oral)  Ht '5\' 5"'$  (1.651 m)  Wt 167 lb (75.751 kg)  BMI 27.79 kg/m2       Assessment & Plan:  1. Thrombocytopenia (HCC) -No sign of any bleeding. - CBC with Differential/Platelet  2. Vitamin D deficiency -Continue vitamin D replacement. Results of lab work - CBC with Differential/Platelet - VITAMIN D 25 Hydroxy (Vit-D Deficiency, Fractures)  3. Essential hypertension -The blood pressure is elevated today on the systolic side but no change in treatment is recommended due to his living by himself and the frailty of his  condition. - BMP8+EGFR - CBC with Differential/Platelet - Hepatic function panel  4. Hyperlipidemia -Continue current treatment pending results of lab work - CBC with Differential/Platelet - Lipid panel  5. Frailty -Continue daily supervision and more possible. Patient was offered in the past operative to go to the assisted living facility and he  refused on multiple occasions. His sister and niece check on him daily. - CBC with Differential/Platelet  6. Paroxysmal atrial fibrillation (HCC) -The rate is controlled at 72/m though he remains in atrial fibrillation - CBC with Differential/Platelet  7. Urinary and bowel incontinence -Continue with depends as needed  8. Memory impairment -This continues to get worse. I'm not sure how much longer he can stay at home. He will most likely remain there until he falls and has an accident and ends  up in a a nursing home  Patient Instructions                       Medicare Annual Wellness Visit  [facility] and the medical providers at [facility] strive to bring you the best medical care.  In doing so we not only want to address your current medical conditions and concerns but also to detect new conditions early and prevent illness, disease and health-related problems.    Medicare offers a yearly Wellness Visit which allows our clinical staff to assess your need for preventative services including immunizations, lifestyle education, counseling to decrease risk of preventable diseases and screening for fall risk and other medical concerns.    This visit is provided free of charge (no copay) for all Medicare recipients. The clinical pharmacists at [facility] have begun to conduct these Wellness Visits which will also include a thorough review of all your medications.    As you primary medical provider recommend that you make an appointment for your Annual Wellness Visit if you have not done so  already this year.  You may set up this appointment before you leave today or you may call back ([phone]) and schedule an appointment.  Please make sure when you call that you mention that you are scheduling your Annual Wellness Visit with the clinical pharmacist so that the appointment may be made for the proper length of time.     Continue current medications. Continue good therapeutic lifestyle changes which include good diet and exercise. Fall precautions discussed with patient. If an FOBT was given today- please return it to our front desk. If you are over 39 years old - you may need Prevnar 30 or the adult Pneumonia vaccine.  **Flu shots are available--- please call and schedule a FLU-CLINIC appointment**  After your visit with [place] today you will receive a survey in the mail or online from [employer] regarding your care with [place]. Please take a moment to fill this out. Your feedback is very important to [place] as you can help [place] better understand your patient needs as well as improve your experience and satisfaction. WE CARE ABOUT YOU!!!   Take medications as regularly as possible Use the cane or a walker as needed for ambulation Drink plenty of fluids Continue to use the depends for bladder and bowel incontinence We will call with lab work results as soon as those results become available No change in medications   Arrie Senate MD

## 2016-01-15 NOTE — Patient Instructions (Addendum)
Medicare Annual Wellness Visit  [facility] and the medical providers at [facility] strive to bring you the best medical care.  In doing so we not only want to address your current medical conditions and concerns but also to detect new conditions early and prevent illness, disease and health-related problems.    Medicare offers a yearly Wellness Visit which allows our clinical staff to assess your need for preventative services including immunizations, lifestyle education, counseling to decrease risk of preventable diseases and screening for fall risk and other medical concerns.    This visit is provided free of charge (no copay) for all Medicare recipients. The clinical pharmacists at [facility] have begun to conduct these Wellness Visits which will also include a thorough review of all your medications.    As you primary medical provider recommend that you make an appointment for your Annual Wellness Visit if you have not done so already this year.  You may set up this appointment before you leave today or you may call back [phone] and schedule an appointment.  Please make sure when you call that you mention that you are scheduling your Annual Wellness Visit with the clinical pharmacist so that the appointment may be made for the proper length of time.     Continue current medications. Continue good therapeutic lifestyle changes which include good diet and exercise. Fall precautions discussed with patient. If an FOBT was given today- please return it to our front desk. If you are over 35 years old - you may need Prevnar 1 or the adult Pneumonia vaccine.  **Flu shots are available--- please call and schedule a FLU-CLINIC appointment**  After your visit with [place] today you will receive a survey in the mail or online from [employer] regarding your care with [place]. Please take a moment to fill this out. Your feedback is very  important to [place] as you can help [place] better understand your patient needs as well as improve your experience and satisfaction. WE CARE ABOUT YOU!!!   Take medications as regularly as possible Use the cane or a walker as needed for ambulation Drink plenty of fluids Continue to use the depends for bladder and bowel incontinence We will call with lab work results as soon as those results become available No change in medications

## 2016-01-16 LAB — HEPATIC FUNCTION PANEL
ALK PHOS: 59 IU/L (ref 39–117)
ALT: 11 IU/L (ref 0–44)
AST: 17 IU/L (ref 0–40)
Albumin: 4.1 g/dL (ref 3.2–4.6)
BILIRUBIN TOTAL: 0.4 mg/dL (ref 0.0–1.2)
BILIRUBIN, DIRECT: 0.15 mg/dL (ref 0.00–0.40)
Total Protein: 6.7 g/dL (ref 6.0–8.5)

## 2016-01-16 LAB — CBC WITH DIFFERENTIAL/PLATELET
BASOS: 1 %
Basophils Absolute: 0.1 10*3/uL (ref 0.0–0.2)
EOS (ABSOLUTE): 0.5 10*3/uL — ABNORMAL HIGH (ref 0.0–0.4)
EOS: 7 %
HEMATOCRIT: 38.8 % (ref 37.5–51.0)
HEMOGLOBIN: 12.3 g/dL — AB (ref 12.6–17.7)
IMMATURE GRANULOCYTES: 0 %
Immature Grans (Abs): 0 10*3/uL (ref 0.0–0.1)
Lymphocytes Absolute: 2.1 10*3/uL (ref 0.7–3.1)
Lymphs: 29 %
MCH: 27.7 pg (ref 26.6–33.0)
MCHC: 31.7 g/dL (ref 31.5–35.7)
MCV: 87 fL (ref 79–97)
MONOCYTES: 10 %
MONOS ABS: 0.7 10*3/uL (ref 0.1–0.9)
NEUTROS PCT: 53 %
Neutrophils Absolute: 3.8 10*3/uL (ref 1.4–7.0)
Platelets: 168 10*3/uL (ref 150–379)
RBC: 4.44 x10E6/uL (ref 4.14–5.80)
RDW: 15 % (ref 12.3–15.4)
WBC: 7.2 10*3/uL (ref 3.4–10.8)

## 2016-01-16 LAB — BMP8+EGFR
BUN / CREAT RATIO: 17 (ref 10–22)
BUN: 19 mg/dL (ref 10–36)
CO2: 28 mmol/L (ref 18–29)
CREATININE: 1.09 mg/dL (ref 0.76–1.27)
Calcium: 9.1 mg/dL (ref 8.6–10.2)
Chloride: 99 mmol/L (ref 96–106)
GFR calc Af Amer: 65 mL/min/{1.73_m2} (ref >59)
GFR, EST NON AFRICAN AMERICAN: 56 mL/min/{1.73_m2} — AB (ref >59)
Glucose: 103 mg/dL — ABNORMAL HIGH (ref 65–99)
Potassium: 4.6 mmol/L (ref 3.5–5.2)
SODIUM: 142 mmol/L (ref 134–144)

## 2016-01-16 LAB — LIPID PANEL
CHOL/HDL RATIO: 2.5 ratio (ref 0.0–5.0)
Cholesterol, Total: 126 mg/dL (ref 100–199)
HDL: 50 mg/dL (ref >39)
LDL Calculated: 60 mg/dL (ref 0–99)
Triglycerides: 80 mg/dL (ref 0–149)
VLDL Cholesterol Cal: 16 mg/dL (ref 5–40)

## 2016-01-16 LAB — VITAMIN D 25 HYDROXY (VIT D DEFICIENCY, FRACTURES): VIT D 25 HYDROXY: 42.9 ng/mL (ref 30.0–100.0)

## 2016-02-09 ENCOUNTER — Other Ambulatory Visit: Payer: Self-pay | Admitting: Family Medicine

## 2016-02-22 ENCOUNTER — Other Ambulatory Visit: Payer: Self-pay | Admitting: Family Medicine

## 2016-02-26 ENCOUNTER — Other Ambulatory Visit: Payer: Self-pay | Admitting: Family Medicine

## 2016-03-24 ENCOUNTER — Other Ambulatory Visit: Payer: Self-pay | Admitting: Family Medicine

## 2016-04-05 DIAGNOSIS — I70203 Unspecified atherosclerosis of native arteries of extremities, bilateral legs: Secondary | ICD-10-CM | POA: Diagnosis not present

## 2016-04-05 DIAGNOSIS — L84 Corns and callosities: Secondary | ICD-10-CM | POA: Diagnosis not present

## 2016-04-05 DIAGNOSIS — B351 Tinea unguium: Secondary | ICD-10-CM | POA: Diagnosis not present

## 2016-04-27 ENCOUNTER — Other Ambulatory Visit: Payer: Self-pay | Admitting: Family Medicine

## 2016-05-19 ENCOUNTER — Ambulatory Visit: Payer: Medicare Other | Admitting: Family Medicine

## 2016-06-01 ENCOUNTER — Encounter: Payer: Self-pay | Admitting: Family Medicine

## 2016-06-01 ENCOUNTER — Ambulatory Visit (INDEPENDENT_AMBULATORY_CARE_PROVIDER_SITE_OTHER): Payer: Medicare Other | Admitting: Family Medicine

## 2016-06-01 VITALS — BP 142/84 | HR 66 | Temp 97.4°F | Ht 65.0 in | Wt 164.0 lb

## 2016-06-01 DIAGNOSIS — I480 Paroxysmal atrial fibrillation: Secondary | ICD-10-CM | POA: Diagnosis not present

## 2016-06-01 DIAGNOSIS — R2681 Unsteadiness on feet: Secondary | ICD-10-CM | POA: Diagnosis not present

## 2016-06-01 DIAGNOSIS — I10 Essential (primary) hypertension: Secondary | ICD-10-CM | POA: Diagnosis not present

## 2016-06-01 DIAGNOSIS — D696 Thrombocytopenia, unspecified: Secondary | ICD-10-CM

## 2016-06-01 DIAGNOSIS — E559 Vitamin D deficiency, unspecified: Secondary | ICD-10-CM | POA: Diagnosis not present

## 2016-06-01 DIAGNOSIS — R54 Age-related physical debility: Secondary | ICD-10-CM | POA: Diagnosis not present

## 2016-06-01 DIAGNOSIS — E785 Hyperlipidemia, unspecified: Secondary | ICD-10-CM

## 2016-06-01 NOTE — Progress Notes (Signed)
Subjective:    Patient ID: [name] [name], male    DOB: Jul 08, 1915, 82 y.o.   MRN: [id number]  HPI  Pt here for follow up and management of chronic medical problems which includes a fib and hyperlipidemia. He is taking medications regularly.The patient comes to the visit today with his sister. [name] is now 81 years old and still living by himself with frequent checks by his sister. We are not going to do a rectal exam or give him an FOBT. He refuses the FOBT. He'll get lab work today. He has no complaints. The patient's sister and niece bring him food and check on him daily. The drugstore brings his medications and occasionally he does not take them but the majority time he does take the medicines. He is taking an aspirin now instead of warfarin. She denies chest pain shortness of breath trouble with his GI tract or problems with voiding. He was wearing depends but has not been doing that as much recently and is dry today. He has diminished hearing. He will be 82 years old in November.     Patient Active Problem List   Diagnosis Date Noted  . Gait instability 02/05/2015  . Platelets decreased ([place]) 09/30/2014  . 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 Thrombocytopenia ([place]) 09/30/2014  . At high risk for falls 08/08/2014  . Frailty 05/21/2014  . HYPERLIPIDEMIA-MIXED 03/19/2009  . CAD, UNSPECIFIED SITE 03/19/2009  . ATRIAL FIBRILLATION 03/19/2009  . EDEMA 03/19/2009  . BENIGN PROSTATIC HYPERTROPHY, HX OF 03/19/2009   Outpatient Encounter Prescriptions as of 06/01/2016  Medication Sig  . ASPIRIN EC LO-DOSE 81 MG EC tablet TAKE 1 TABLET DAILY  . Calcium Carbonate-Vitamin D 600-400 MG-UNIT per tablet Take 1 tablet by mouth daily.    Marland Kitchen desoximetasone (TOPICORT) 0.05 % cream Apply topically 2 (two) times daily.  Marland Kitchen docusate sodium (COLACE) 100 MG capsule Take 100 mg by mouth as needed.    . Fluocinonide Emulsified Base 0.05 % CREA Apply 1 application topically 2 (two) times daily.  .  furosemide (LASIX) 40 MG tablet TAKE 1 TABLET ONCE DAILY AS NEEDED FOR SEVERE SWELLING  . isosorbide dinitrate (ISORDIL) 10 MG tablet TAKE (1) TABLET TWICE A DAY.  Marland Kitchen lisinopril (PRINIVIL,ZESTRIL) 10 MG tablet TAKE 1 TABLET ONCE A DAY  . metoprolol (LOPRESSOR) 50 MG tablet TAKE (1) TABLET TWICE A DAY.  Marland Kitchen Omega-3 Fatty Acids (FISH OIL) 1000 MG CAPS Take 3 capsules by mouth daily. 1000 mg?   . omeprazole (PRILOSEC) 20 MG capsule TAKE (1) CAPSULE DAILY  . senna (SENOKOT) 8.6 MG tablet Take 1 tablet by mouth daily. prn  . simvastatin (ZOCOR) 40 MG tablet TAKE 1 TABLET IN THE EVENING FOR CHOLESTEROL  . triamcinolone cream (KENALOG) 0.1 % Pharmacy to mix 120g of trimacinolone 1% with 120g of eucerin Apply to legs BID   No facility-administered encounter medications on file as of 06/01/2016.     Review of Systems  Constitutional: Negative.   HENT: Negative.   Eyes: Negative.   Respiratory: Negative.   Cardiovascular: Negative.   Gastrointestinal: Negative.   Endocrine: Negative.   Genitourinary: Negative.   Musculoskeletal: Negative.   Skin: Negative.   Allergic/Immunologic: Negative.   Neurological: Negative.   Hematological: Negative.   Psychiatric/Behavioral: Negative.        Objective:   Physical Exam  Constitutional: He appears well-developed and well-nourished. No distress.  Elderly and frail with diminished hearing  HENT:  Head: Normocephalic and atraumatic.  Right Ear: External  ear normal.  Left Ear: External ear normal.  Nose: Nose normal.  Mouth/Throat: Oropharynx is clear and moist. No oropharyngeal exudate.  Eyes: Conjunctivae and EOM are normal. Pupils are equal, round, and reactive to light. Right eye exhibits no discharge. Left eye exhibits no discharge. No scleral icterus.  Neck: Normal range of motion. Neck supple. No thyromegaly present.  No bruits or thyromegaly  Cardiovascular: Normal rate and normal heart sounds.   No murmur heard. Heart is irregular  irregular at 60/m  Pulmonary/Chest: Effort normal and breath sounds normal. No respiratory distress. He has no wheezes. He has no rales. He exhibits no tenderness.  Clear anteriorly and posteriorly  Abdominal: Soft. Bowel sounds are normal. He exhibits no mass. There is no tenderness. There is no rebound and no guarding.  Slight right upper quadrant tenderness but no masses or organ enlargement  Musculoskeletal: Normal range of motion. He exhibits edema. He exhibits no tenderness.  One plus pretibial edema bilaterally  Lymphadenopathy:    He has no cervical adenopathy.  Neurological: He is alert. No cranial nerve deficit.  The patient does have some memory loss and was unable to recall my name and his his sister says she cannot be called by the correct name at times.  Skin: Skin is warm and dry. No rash noted.  Psychiatric: He has a normal mood and affect. His behavior is normal. Judgment and thought content normal.  Nursing note and vitals reviewed.  BP 142/84 mmHg  Pulse 66  Temp(Src) 97.4 F (36.3 C) (Oral)  Ht _0  (1.651 m)  Wt 164 lb (74.39 kg)  BMI 27.29 kg/m2        Assessment & Plan:  1. Thrombocytopenia (HCC) -No increased bruising was noted and no history of any blood loss was noted - CBC with Differential/Platelet  2. Vitamin D deficiency -Continue current treatment pending results of lab work - CBC with Differential/Platelet - VITAMIN D 25 Hydroxy (Vit-D Deficiency, Fractures)  3. Essential hypertension -The blood pressure slightly elevated today but there will be no change in treatment. - BMP8+EGFR - CBC with Differential/Platelet - Hepatic function panel  4. Hyperlipidemia -Continue current treatment and as aggressive therapeutic lifestyle changes as possible - CBC with Differential/Platelet - Lipid panel  5. Frailty -Stay is active as possible and use the walker at all times - CBC with Differential/Platelet  6. Paroxysmal atrial fibrillation  (HCC) -The patient remains in atrial fibrillation rate controlled and appears to be stable with this. - CBC with Differential/Platelet  7. Gait instability -He must continue to use his walker and cane. - CBC with Differential/Platelet  8. Platelets decreased (HCC) -No increased signs of bruising. We will check a CBC.  Patient Instructions                       Medicare Annual Wellness Visit  [facility] and the medical providers at [facility] strive to bring you the best medical care.  In doing so we not only want to address your current medical conditions and concerns but also to detect new conditions early and prevent illness, disease and health-related problems.    Medicare offers a yearly Wellness Visit which allows our clinical staff to assess your need for preventative services including immunizations, lifestyle education, counseling to decrease risk of preventable diseases and screening for fall risk and other medical concerns.    This visit is provided free of charge (no copay) for all Medicare recipients. The  clinical pharmacists at [facility] have begun to conduct these Wellness Visits which will also include a thorough review of all your medications.    As you primary medical provider recommend that you make an appointment for your Annual Wellness Visit if you have not done so already this year.  You may set up this appointment before you leave today or you may call back ([phone]) and schedule an appointment.  Please make sure when you call that you mention that you are scheduling your Annual Wellness Visit with the clinical pharmacist so that the appointment may be made for the proper length of time.     Continue current medications. Continue good therapeutic lifestyle changes which include good diet and exercise. Fall precautions discussed with patient. If an FOBT was given today- please return it to our front desk. If you are  over 12 years old - you may need Prevnar 78 or the adult Pneumonia vaccine.  **Flu shots are available--- please call and schedule a FLU-CLINIC appointment**  After your visit with [place] today you will receive a survey in the mail or online from [employer] regarding your care with [place]. Please take a moment to fill this out. Your feedback is very important to [place] as you can help [place] better understand your patient needs as well as improve your experience and satisfaction. WE CARE ABOUT YOU!!!   The patient should continue to be careful and not put himself at risk for falling He should use his walker as he is doing around the home His family should check on him regularly and make sure that he is taking his medicines as regularly as possible The should make sure that he is eating regularly and drinking plenty of fluids This summer may need to wear better support hose to keep the edema down since he is less active   Arrie Senate MD

## 2016-06-01 NOTE — Patient Instructions (Addendum)
Medicare Annual Wellness Visit  [facility] and the medical providers at [facility] strive to bring you the best medical care.  In doing so we not only want to address your current medical conditions and concerns but also to detect new conditions early and prevent illness, disease and health-related problems.    Medicare offers a yearly Wellness Visit which allows our clinical staff to assess your need for preventative services including immunizations, lifestyle education, counseling to decrease risk of preventable diseases and screening for fall risk and other medical concerns.    This visit is provided free of charge (no copay) for all Medicare recipients. The clinical pharmacists at [facility] have begun to conduct these Wellness Visits which will also include a thorough review of all your medications.    As you primary medical provider recommend that you make an appointment for your Annual Wellness Visit if you have not done so already this year.  You may set up this appointment before you leave today or you may call back [phone] and schedule an appointment.  Please make sure when you call that you mention that you are scheduling your Annual Wellness Visit with the clinical pharmacist so that the appointment may be made for the proper length of time.     Continue current medications. Continue good therapeutic lifestyle changes which include good diet and exercise. Fall precautions discussed with patient. If an FOBT was given today- please return it to our front desk. If you are over 22 years old - you may need Prevnar 67 or the adult Pneumonia vaccine.  **Flu shots are available--- please call and schedule a FLU-CLINIC appointment**  After your visit with [place] today you will receive a survey in the mail or online from [employer] regarding your care with [place]. Please take a moment to fill this out. Your feedback is very  important to [place] as you can help [place] better understand your patient needs as well as improve your experience and satisfaction. WE CARE ABOUT YOU!!!   The patient should continue to be careful and not put himself at risk for falling He should use his walker as he is doing around the home His family should check on him regularly and make sure that he is taking his medicines as regularly as possible The should make sure that he is eating regularly and drinking plenty of fluids This summer may need to wear better support hose to keep the edema down since he is less active

## 2016-06-02 ENCOUNTER — Telehealth: Payer: Self-pay | Admitting: *Deleted

## 2016-06-02 LAB — VITAMIN D 25 HYDROXY (VIT D DEFICIENCY, FRACTURES): VIT D 25 HYDROXY: 44.9 ng/mL (ref 30.0–100.0)

## 2016-06-02 LAB — BMP8+EGFR
BUN/Creatinine Ratio: 21 (ref 10–24)
BUN: 24 mg/dL (ref 10–36)
CALCIUM: 9.3 mg/dL (ref 8.6–10.2)
CHLORIDE: 98 mmol/L (ref 96–106)
CO2: 25 mmol/L (ref 18–29)
Creatinine, Ser: 1.15 mg/dL (ref 0.76–1.27)
GFR, EST AFRICAN AMERICAN: 61 mL/min/{1.73_m2} (ref >59)
GFR, EST NON AFRICAN AMERICAN: 53 mL/min/{1.73_m2} — AB (ref >59)
Glucose: 102 mg/dL — ABNORMAL HIGH (ref 65–99)
Potassium: 4.5 mmol/L (ref 3.5–5.2)
Sodium: 141 mmol/L (ref 134–144)

## 2016-06-02 LAB — CBC WITH DIFFERENTIAL/PLATELET
BASOS ABS: 0.1 10*3/uL (ref 0.0–0.2)
BASOS: 1 %
EOS (ABSOLUTE): 0.2 10*3/uL (ref 0.0–0.4)
Eos: 3 %
HEMOGLOBIN: 12.4 g/dL — AB (ref 12.6–17.7)
Hematocrit: 37.9 % (ref 37.5–51.0)
IMMATURE GRANS (ABS): 0 10*3/uL (ref 0.0–0.1)
IMMATURE GRANULOCYTES: 0 %
LYMPHS: 30 %
Lymphocytes Absolute: 2.1 10*3/uL (ref 0.7–3.1)
MCH: 29.2 pg (ref 26.6–33.0)
MCHC: 32.7 g/dL (ref 31.5–35.7)
MCV: 89 fL (ref 79–97)
MONOCYTES: 10 %
Monocytes Absolute: 0.7 10*3/uL (ref 0.1–0.9)
NEUTROS ABS: 4 10*3/uL (ref 1.4–7.0)
NEUTROS PCT: 56 %
PLATELETS: 158 10*3/uL (ref 150–379)
RBC: 4.24 x10E6/uL (ref 4.14–5.80)
RDW: 14.9 % (ref 12.3–15.4)
WBC: 7.1 10*3/uL (ref 3.4–10.8)

## 2016-06-02 LAB — LIPID PANEL
CHOL/HDL RATIO: 2.4 ratio (ref 0.0–5.0)
CHOLESTEROL TOTAL: 124 mg/dL (ref 100–199)
HDL: 51 mg/dL (ref >39)
LDL CALC: 58 mg/dL (ref 0–99)
Triglycerides: 74 mg/dL (ref 0–149)
VLDL CHOLESTEROL CAL: 15 mg/dL (ref 5–40)

## 2016-06-02 LAB — HEPATIC FUNCTION PANEL
ALBUMIN: 4.1 g/dL (ref 3.2–4.6)
ALK PHOS: 57 IU/L (ref 39–117)
ALT: 11 IU/L (ref 0–44)
AST: 18 IU/L (ref 0–40)
BILIRUBIN TOTAL: 0.5 mg/dL (ref 0.0–1.2)
BILIRUBIN, DIRECT: 0.14 mg/dL (ref 0.00–0.40)
TOTAL PROTEIN: 6.9 g/dL (ref 6.0–8.5)

## 2016-06-02 NOTE — Telephone Encounter (Signed)
Pt's sister notified of results Verbalizes understanding 

## 2016-06-02 NOTE — Telephone Encounter (Signed)
-----   Message from Chipper Herb, MD sent at 06/02/2016  7:34 AM EDT ----- Please call the patient's sister with these results The blood sugar slightly elevated at 102. The creatinine, the most important kidney function test remains within normal limits. The electrolytes including potassium are good. The CBC has a normal white blood cell count. The hemoglobin is slightly decreased but stable and consistent with past readings at 12.4. The platelet count is adequate. All liver function tests are within normal limits Cholesterol numbers with traditional lipid testing are excellent and at goal----------- he should continue with his simvastatin, omega-3 fatty acids and therapeutic lifestyle changes which include diet and exercise as much as possible The vitamin D level is good at 44.9 he should continue with current treatment

## 2016-06-27 ENCOUNTER — Other Ambulatory Visit: Payer: Self-pay | Admitting: Family Medicine

## 2016-07-05 ENCOUNTER — Telehealth: Payer: Self-pay | Admitting: *Deleted

## 2016-07-05 MED ORDER — BUSPIRONE HCL 7.5 MG PO TABS: 7.5000 mg | ORAL_TABLET | Freq: Two times a day (BID) | ORAL | Status: DC

## 2016-07-05 NOTE — Telephone Encounter (Signed)
Pt sister aware Med sent to [facility]

## 2016-07-05 NOTE — Telephone Encounter (Signed)
Sister came by today and states that pt is crying a lot , thinks he is dying, coming to her house crying and she has to carry him home She wants to know if he can have something for depression?  His meds are pre- packed at [facility].  Call sister back with answer.

## 2016-07-05 NOTE — Telephone Encounter (Signed)
Try BuSpar 7.5 milligrams twice daily-----have-sister to call us back and let us know if this is helping any

## 2016-07-29 ENCOUNTER — Other Ambulatory Visit: Payer: Self-pay | Admitting: Family Medicine

## 2016-08-12 ENCOUNTER — Other Ambulatory Visit: Payer: Self-pay | Admitting: Family Medicine

## 2016-08-27 ENCOUNTER — Other Ambulatory Visit: Payer: Self-pay | Admitting: Family Medicine

## 2016-10-05 ENCOUNTER — Ambulatory Visit (INDEPENDENT_AMBULATORY_CARE_PROVIDER_SITE_OTHER): Payer: Medicare Other | Admitting: Family Medicine

## 2016-10-05 ENCOUNTER — Encounter: Payer: Self-pay | Admitting: Family Medicine

## 2016-10-05 VITALS — BP 137/69 | HR 66 | Temp 96.9°F | Ht 65.0 in | Wt 160.8 lb

## 2016-10-05 DIAGNOSIS — I10 Essential (primary) hypertension: Secondary | ICD-10-CM

## 2016-10-05 DIAGNOSIS — Z23 Encounter for immunization: Secondary | ICD-10-CM

## 2016-10-05 DIAGNOSIS — R2681 Unsteadiness on feet: Secondary | ICD-10-CM

## 2016-10-05 DIAGNOSIS — E559 Vitamin D deficiency, unspecified: Secondary | ICD-10-CM | POA: Diagnosis not present

## 2016-10-05 DIAGNOSIS — R413 Other amnesia: Secondary | ICD-10-CM

## 2016-10-05 DIAGNOSIS — D696 Thrombocytopenia, unspecified: Secondary | ICD-10-CM

## 2016-10-05 DIAGNOSIS — E7800 Pure hypercholesterolemia, unspecified: Secondary | ICD-10-CM

## 2016-10-05 DIAGNOSIS — I480 Paroxysmal atrial fibrillation: Secondary | ICD-10-CM

## 2016-10-05 DIAGNOSIS — F320 Major depressive disorder, single episode, mild: Secondary | ICD-10-CM | POA: Diagnosis not present

## 2016-10-05 DIAGNOSIS — R54 Age-related physical debility: Secondary | ICD-10-CM | POA: Diagnosis not present

## 2016-10-05 NOTE — Patient Instructions (Addendum)
Continue current medications. Continue good therapeutic lifestyle changes which include good diet and exercise. Fall precautions discussed with patient. If an FOBT was given today- please return it to our front desk. If you are over 80 years old - you may need Prevnar 61 or the adult Pneumonia vaccine.  **Flu shots are available--- please call and schedule a FLU-CLINIC appointment**  After your visit with [place] today you will receive a survey in the mail or online from [employer] regarding your care with [place]. Please take a moment to fill this out. Your feedback is very important to [place] as you can help [place] better understand your patient needs as well as improve your experience and satisfaction. WE CARE ABOUT YOU!!!   Continue to drink plenty of fluids especially water If the Family notices there are any problems please have them to get in touch with [place]

## 2016-10-05 NOTE — Progress Notes (Signed)
Subjective:    Patient ID: [name] [name], male    DOB: 1915/08/19, 82 y.o.   MRN: [id number]  HPI  Pt is here today for follow up of chronic medical conditons which include hypertension, hyperlipidemia, GERD and atrial fibrillation.The patient remains at home by himself. His sister and niece keep a regular check on him. He will get lab work today. His last chest x-ray was done a year ago. We will not do a rectal exam or a fecal occult blood test card because of his age and unless his hemoglobin is really low. The patient has dementia. The patient denies any chest pain or shortness of breath. His sister says he pretty much sits in the house all day long and does not have the TV on. He is checked on every day. He has warm food at lunch and warm food at dinner every night. He does like to watch baseball nebulizer encourage him to do some of this sign. He denies any trouble with his stomach including nausea vomiting diarrhea blood in the stool or black tarry bowel movements. He is passing his water without problems. Sometimes he is incontinent but will not wear depends which she has at home to wear.   Patient Active Problem List   Diagnosis Date Noted  . Gait instability 02/05/2015  . Platelets decreased ([place]) 09/30/2014  . 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 Thrombocytopenia ([place]) 09/30/2014  . At high risk for falls 08/08/2014  . Frailty 05/21/2014  . Hyperlipidemia 03/19/2009  . CAD, UNSPECIFIED SITE 03/19/2009  . ATRIAL FIBRILLATION 03/19/2009  . EDEMA 03/19/2009  . BENIGN PROSTATIC HYPERTROPHY, HX OF 03/19/2009   Outpatient Encounter Prescriptions as of 10/05/2016  Medication Sig  . ASPIRIN EC LO-DOSE 81 MG EC tablet TAKE 1 TABLET DAILY  . busPIRone (BUSPAR) 15 MG tablet Take 1/2 tablet (7.5 mg total) by mouth 2 (two) times daily.  . Calcium Carbonate-Vitamin D 600-400 MG-UNIT per tablet Take 1 tablet by mouth daily.    Marland Kitchen desoximetasone (TOPICORT) 0.05 % cream Apply topically 2 (two)  times daily.  Marland Kitchen docusate sodium (COLACE) 100 MG capsule Take 100 mg by mouth as needed.    . Fluocinonide Emulsified Base 0.05 % CREA Apply 1 application topically 2 (two) times daily.  . furosemide (LASIX) 40 MG tablet TAKE 1 TABLET ONCE DAILY AS NEEDED FOR SEVERE SWELLING  . isosorbide dinitrate (ISORDIL) 10 MG tablet TAKE (1) TABLET TWICE A DAY.  Marland Kitchen lisinopril (PRINIVIL,ZESTRIL) 10 MG tablet TAKE 1 TABLET ONCE A DAY  . metoprolol (LOPRESSOR) 50 MG tablet TAKE (1) TABLET TWICE A DAY.  Marland Kitchen Omega-3 Fatty Acids (FISH OIL) 1000 MG CAPS Take 3 capsules by mouth daily. 1000 mg?   . omeprazole (PRILOSEC) 20 MG capsule TAKE (1) CAPSULE DAILY  . senna (SENOKOT) 8.6 MG tablet Take 1 tablet by mouth daily. prn  . simvastatin (ZOCOR) 40 MG tablet TAKE 1 TABLET IN THE EVENING FOR CHOLESTEROL  . triamcinolone cream (KENALOG) 0.1 % Pharmacy to mix 120g of trimacinolone 1% with 120g of eucerin Apply to legs BID   No facility-administered encounter medications on file as of 10/05/2016.        Review of Systems  Constitutional: Positive for fatigue. Negative for appetite change.  HENT: Negative.   Eyes: Negative.   Respiratory: Negative.   Cardiovascular: Negative.   Gastrointestinal: Negative.   Endocrine: Negative.   Genitourinary: Negative.   Allergic/Immunologic: Negative.   Neurological: Negative.   Psychiatric/Behavioral: Negative.  Objective:   Physical Exam  Constitutional: He is oriented to person, place, and time. He appears well-developed and well-nourished. No distress.  Elderly white male in no acute distress. He responded appropriately to questions and was able to read my name on the name badge.  HENT:  Head: Normocephalic and atraumatic.  Right Ear: External ear normal.  Left Ear: External ear normal.  Nose: Nose normal.  Mouth/Throat: Oropharynx is clear and moist. No oropharyngeal exudate.  Eyes: Conjunctivae and EOM are normal. Pupils are equal, round, and reactive to  light. Right eye exhibits no discharge. Left eye exhibits no discharge. No scleral icterus.  Neck: Normal range of motion. Neck supple. No thyromegaly present.  No bruits or thyromegaly  Cardiovascular: Normal rate.   No murmur heard. The heart is irregular irregular at 84/m  Pulmonary/Chest: Effort normal and breath sounds normal. No respiratory distress. He has no wheezes. He has no rales. He exhibits no tenderness.  Clear anteriorly and posteriorly and no axillary adenopathy  Abdominal: Soft. Bowel sounds are normal. He exhibits no mass. There is no tenderness. There is no rebound and no guarding.  No abdominal tenderness liver or spleen enlargement or masses palpable  Genitourinary:  Genitourinary Comments: This exam was deferred today because of his age.  Musculoskeletal: Normal range of motion. He exhibits no edema.  The patient uses a walker for ambulation. He also uses this around the house.  Lymphadenopathy:    He has no cervical adenopathy.  Neurological: He is alert and oriented to person, place, and time.  Skin: Skin is warm and dry. No rash noted.  Psychiatric: He has a normal mood and affect. His behavior is normal. Judgment and thought content normal.  Nursing note and vitals reviewed.  BP 137/69 (BP Location: Left Arm, Patient Position: Sitting, Cuff Size: Normal)   Pulse 66   Temp (!) 96.9 F (36.1 C) (Oral)   Ht 5\' 5"  (1.651 m)   Wt 160 lb 12.8 oz (72.9 kg)   BMI 26.76 kg/m         Assessment & Plan:  1. Thrombocytopenia ([place]) -The patient nor his sister describe any history of bleeding issues.  2. Essential hypertension -The blood pressure is good today and he will continue with current treatment  3. Frailty -He should continue to use his walker and be careful not to fall  4. Gait instability -Continue with walker use  5. Paroxysmal atrial fibrillation (HCC) -He remains in atrial fibrillation with rate controlled atrial fibrillation.  6. Pure  hypercholesterolemia -Continue with simvastatin  7. Vitamin D deficiency -Continue with vitamin D replacement pending results of lab work  8. Platelets decreased ([place]) -No history of bleeding issues.  9. Memory impairment -The memory appears stable today.  10. Mild single current episode of major depressive disorder ([place]) -The sister notes that he is crying less since he's been on the BuSpar.  Patient Instructions  Continue current medications. Continue good therapeutic lifestyle changes which include good diet and exercise. Fall precautions discussed with patient. If an FOBT was given today- please return it to our front desk. If you are over 80 years old - you may need Prevnar 53 or the adult Pneumonia vaccine.  **Flu shots are available--- please call and schedule a FLU-CLINIC appointment**  After your visit with [place] today you will receive a survey in the mail or online from [employer] regarding your care with [place]. Please take a moment to fill this out. Your feedback is very  important to [place] as you can help [place] better understand your patient needs as well as improve your experience and satisfaction. WE CARE ABOUT YOU!!!   Continue to drink plenty of fluids especially water If the Family notices there are any problems please have them to get in touch with [place]    Arrie Senate MD

## 2016-10-05 NOTE — Addendum Note (Signed)
Addended by: Marin Olp on: 10/05/2016 04:28 PM   Modules accepted: Orders

## 2016-10-06 LAB — BMP8+EGFR
BUN / CREAT RATIO: 21 (ref 10–24)
BUN: 22 mg/dL (ref 10–36)
CO2: 28 mmol/L (ref 18–29)
CREATININE: 1.07 mg/dL (ref 0.76–1.27)
Calcium: 9.1 mg/dL (ref 8.6–10.2)
Chloride: 99 mmol/L (ref 96–106)
GFR, EST AFRICAN AMERICAN: 66 mL/min/{1.73_m2} (ref >59)
GFR, EST NON AFRICAN AMERICAN: 57 mL/min/{1.73_m2} — AB (ref >59)
Glucose: 83 mg/dL (ref 65–99)
Potassium: 4.4 mmol/L (ref 3.5–5.2)
Sodium: 143 mmol/L (ref 134–144)

## 2016-10-06 LAB — CBC WITH DIFFERENTIAL/PLATELET
BASOS: 0 %
Basophils Absolute: 0 10*3/uL (ref 0.0–0.2)
EOS (ABSOLUTE): 0.2 10*3/uL (ref 0.0–0.4)
Eos: 2 %
HEMOGLOBIN: 12.9 g/dL (ref 12.6–17.7)
Hematocrit: 41.3 % (ref 37.5–51.0)
IMMATURE GRANS (ABS): 0 10*3/uL (ref 0.0–0.1)
Immature Granulocytes: 0 %
LYMPHS ABS: 2.4 10*3/uL (ref 0.7–3.1)
LYMPHS: 32 %
MCH: 28.9 pg (ref 26.6–33.0)
MCHC: 31.2 g/dL — ABNORMAL LOW (ref 31.5–35.7)
MCV: 92 fL (ref 79–97)
MONOCYTES: 10 %
Monocytes Absolute: 0.8 10*3/uL (ref 0.1–0.9)
NEUTROS ABS: 4.2 10*3/uL (ref 1.4–7.0)
Neutrophils: 56 %
Platelets: 153 10*3/uL (ref 150–379)
RBC: 4.47 x10E6/uL (ref 4.14–5.80)
RDW: 14.7 % (ref 12.3–15.4)
WBC: 7.6 10*3/uL (ref 3.4–10.8)

## 2016-10-06 LAB — HEPATIC FUNCTION PANEL
ALBUMIN: 4.4 g/dL (ref 3.2–4.6)
ALK PHOS: 57 IU/L (ref 39–117)
ALT: 13 IU/L (ref 0–44)
AST: 19 IU/L (ref 0–40)
BILIRUBIN, DIRECT: 0.14 mg/dL (ref 0.00–0.40)
Bilirubin Total: 0.4 mg/dL (ref 0.0–1.2)
TOTAL PROTEIN: 6.8 g/dL (ref 6.0–8.5)

## 2016-10-06 LAB — LIPID PANEL
CHOLESTEROL TOTAL: 126 mg/dL (ref 100–199)
Chol/HDL Ratio: 2.4 ratio units (ref 0.0–5.0)
HDL: 52 mg/dL (ref >39)
LDL Calculated: 50 mg/dL (ref 0–99)
Triglycerides: 119 mg/dL (ref 0–149)
VLDL CHOLESTEROL CAL: 24 mg/dL (ref 5–40)

## 2016-10-24 ENCOUNTER — Other Ambulatory Visit: Payer: Self-pay | Admitting: Family Medicine

## 2016-10-26 ENCOUNTER — Other Ambulatory Visit: Payer: Self-pay | Admitting: Family Medicine

## 2016-11-18 ENCOUNTER — Other Ambulatory Visit: Payer: Self-pay | Admitting: Family Medicine

## 2016-12-06 ENCOUNTER — Other Ambulatory Visit: Payer: Self-pay | Admitting: Family Medicine

## 2016-12-15 DIAGNOSIS — M79676 Pain in unspecified toe(s): Secondary | ICD-10-CM | POA: Diagnosis not present

## 2016-12-15 DIAGNOSIS — B351 Tinea unguium: Secondary | ICD-10-CM | POA: Diagnosis not present

## 2016-12-24 ENCOUNTER — Emergency Department (HOSPITAL_COMMUNITY): Payer: Medicare Other

## 2016-12-24 ENCOUNTER — Inpatient Hospital Stay (HOSPITAL_COMMUNITY)
Admission: EM | Admit: 2016-12-24 | Discharge: 2016-12-27 | DRG: 884 | Disposition: A | Discharge status: Skilled Nursing Facility | Payer: Medicare Other | Attending: Nephrology | Admitting: Nephrology

## 2016-12-24 ENCOUNTER — Encounter (HOSPITAL_COMMUNITY): Payer: Self-pay | Admitting: Emergency Medicine

## 2016-12-24 DIAGNOSIS — Z8249 Family history of ischemic heart disease and other diseases of the circulatory system: Secondary | ICD-10-CM | POA: Diagnosis not present

## 2016-12-24 DIAGNOSIS — I517 Cardiomegaly: Secondary | ICD-10-CM | POA: Diagnosis not present

## 2016-12-24 DIAGNOSIS — J069 Acute upper respiratory infection, unspecified: Secondary | ICD-10-CM | POA: Diagnosis not present

## 2016-12-24 DIAGNOSIS — I639 Cerebral infarction, unspecified: Secondary | ICD-10-CM | POA: Diagnosis not present

## 2016-12-24 DIAGNOSIS — R531 Weakness: Secondary | ICD-10-CM | POA: Diagnosis not present

## 2016-12-24 DIAGNOSIS — Z743 Need for continuous supervision: Secondary | ICD-10-CM | POA: Diagnosis not present

## 2016-12-24 DIAGNOSIS — Z833 Family history of diabetes mellitus: Secondary | ICD-10-CM | POA: Diagnosis not present

## 2016-12-24 DIAGNOSIS — R2681 Unsteadiness on feet: Secondary | ICD-10-CM | POA: Diagnosis present

## 2016-12-24 DIAGNOSIS — Y92019 Unspecified place in single-family (private) house as the place of occurrence of the external cause: Secondary | ICD-10-CM | POA: Diagnosis not present

## 2016-12-24 DIAGNOSIS — N400 Enlarged prostate without lower urinary tract symptoms: Secondary | ICD-10-CM | POA: Diagnosis not present

## 2016-12-24 DIAGNOSIS — Z9049 Acquired absence of other specified parts of digestive tract: Secondary | ICD-10-CM

## 2016-12-24 DIAGNOSIS — M6281 Muscle weakness (generalized): Secondary | ICD-10-CM | POA: Diagnosis present

## 2016-12-24 DIAGNOSIS — G309 Alzheimer's disease, unspecified: Secondary | ICD-10-CM | POA: Diagnosis present

## 2016-12-24 DIAGNOSIS — R2689 Other abnormalities of gait and mobility: Secondary | ICD-10-CM | POA: Diagnosis not present

## 2016-12-24 DIAGNOSIS — I4891 Unspecified atrial fibrillation: Secondary | ICD-10-CM | POA: Diagnosis not present

## 2016-12-24 DIAGNOSIS — K5900 Constipation, unspecified: Secondary | ICD-10-CM | POA: Diagnosis not present

## 2016-12-24 DIAGNOSIS — E782 Mixed hyperlipidemia: Secondary | ICD-10-CM | POA: Diagnosis not present

## 2016-12-24 DIAGNOSIS — I480 Paroxysmal atrial fibrillation: Secondary | ICD-10-CM | POA: Diagnosis not present

## 2016-12-24 DIAGNOSIS — I10 Essential (primary) hypertension: Secondary | ICD-10-CM | POA: Diagnosis present

## 2016-12-24 DIAGNOSIS — R269 Unspecified abnormalities of gait and mobility: Secondary | ICD-10-CM | POA: Diagnosis present

## 2016-12-24 DIAGNOSIS — I482 Chronic atrial fibrillation: Secondary | ICD-10-CM | POA: Diagnosis present

## 2016-12-24 DIAGNOSIS — I771 Stricture of artery: Secondary | ICD-10-CM | POA: Diagnosis not present

## 2016-12-24 DIAGNOSIS — F0280 Dementia in other diseases classified elsewhere without behavioral disturbance: Secondary | ICD-10-CM | POA: Diagnosis not present

## 2016-12-24 DIAGNOSIS — I5030 Unspecified diastolic (congestive) heart failure: Secondary | ICD-10-CM | POA: Diagnosis not present

## 2016-12-24 DIAGNOSIS — I2510 Atherosclerotic heart disease of native coronary artery without angina pectoris: Secondary | ICD-10-CM | POA: Diagnosis not present

## 2016-12-24 DIAGNOSIS — R29898 Other symptoms and signs involving the musculoskeletal system: Secondary | ICD-10-CM | POA: Diagnosis not present

## 2016-12-24 DIAGNOSIS — R29705 NIHSS score 5: Secondary | ICD-10-CM | POA: Diagnosis present

## 2016-12-24 DIAGNOSIS — R262 Difficulty in walking, not elsewhere classified: Secondary | ICD-10-CM

## 2016-12-24 DIAGNOSIS — D696 Thrombocytopenia, unspecified: Secondary | ICD-10-CM | POA: Diagnosis not present

## 2016-12-24 DIAGNOSIS — Z880 Allergy status to penicillin: Secondary | ICD-10-CM | POA: Diagnosis not present

## 2016-12-24 DIAGNOSIS — W1840XA Slipping, tripping and stumbling without falling, unspecified, initial encounter: Secondary | ICD-10-CM | POA: Diagnosis present

## 2016-12-24 DIAGNOSIS — Z951 Presence of aortocoronary bypass graft: Secondary | ICD-10-CM

## 2016-12-24 DIAGNOSIS — R279 Unspecified lack of coordination: Secondary | ICD-10-CM | POA: Diagnosis not present

## 2016-12-24 DIAGNOSIS — Z7982 Long term (current) use of aspirin: Secondary | ICD-10-CM

## 2016-12-24 DIAGNOSIS — Z9181 History of falling: Secondary | ICD-10-CM

## 2016-12-24 DIAGNOSIS — I6789 Other cerebrovascular disease: Secondary | ICD-10-CM | POA: Diagnosis not present

## 2016-12-24 DIAGNOSIS — Z66 Do not resuscitate: Secondary | ICD-10-CM | POA: Diagnosis present

## 2016-12-24 DIAGNOSIS — Z79899 Other long term (current) drug therapy: Secondary | ICD-10-CM

## 2016-12-24 DIAGNOSIS — I63233 Cerebral infarction due to unspecified occlusion or stenosis of bilateral carotid arteries: Secondary | ICD-10-CM | POA: Diagnosis not present

## 2016-12-24 DIAGNOSIS — E559 Vitamin D deficiency, unspecified: Secondary | ICD-10-CM | POA: Diagnosis not present

## 2016-12-24 DIAGNOSIS — R404 Transient alteration of awareness: Secondary | ICD-10-CM | POA: Diagnosis not present

## 2016-12-24 DIAGNOSIS — R609 Edema, unspecified: Secondary | ICD-10-CM | POA: Diagnosis not present

## 2016-12-24 DIAGNOSIS — N401 Enlarged prostate with lower urinary tract symptoms: Secondary | ICD-10-CM | POA: Diagnosis not present

## 2016-12-24 DIAGNOSIS — R41841 Cognitive communication deficit: Secondary | ICD-10-CM | POA: Diagnosis present

## 2016-12-24 DIAGNOSIS — K219 Gastro-esophageal reflux disease without esophagitis: Secondary | ICD-10-CM | POA: Diagnosis not present

## 2016-12-24 DIAGNOSIS — E785 Hyperlipidemia, unspecified: Secondary | ICD-10-CM | POA: Diagnosis not present

## 2016-12-24 LAB — URINALYSIS, ROUTINE W REFLEX MICROSCOPIC
Bilirubin Urine: NEGATIVE
GLUCOSE, UA: NEGATIVE mg/dL
Hgb urine dipstick: NEGATIVE
KETONES UR: NEGATIVE mg/dL
Nitrite: POSITIVE — AB
PH: 5 (ref 5.0–8.0)
Protein, ur: NEGATIVE mg/dL
SPECIFIC GRAVITY, URINE: 1.01 (ref 1.005–1.030)

## 2016-12-24 LAB — CBC WITH DIFFERENTIAL/PLATELET
Basophils Absolute: 0 10*3/uL (ref 0.0–0.1)
Basophils Relative: 0 %
Eosinophils Absolute: 0 10*3/uL (ref 0.0–0.7)
Eosinophils Relative: 0 %
HEMATOCRIT: 43.7 % (ref 39.0–52.0)
HEMOGLOBIN: 14 g/dL (ref 13.0–17.0)
LYMPHS ABS: 0.3 10*3/uL — AB (ref 0.7–4.0)
LYMPHS PCT: 4 %
MCH: 30 pg (ref 26.0–34.0)
MCHC: 32 g/dL (ref 30.0–36.0)
MCV: 93.8 fL (ref 78.0–100.0)
MONO ABS: 0.2 10*3/uL (ref 0.1–1.0)
MONOS PCT: 3 %
NEUTROS ABS: 6.4 10*3/uL (ref 1.7–7.7)
NEUTROS PCT: 93 %
Platelets: 139 10*3/uL — ABNORMAL LOW (ref 150–400)
RBC: 4.66 MIL/uL (ref 4.22–5.81)
RDW: 14.6 % (ref 11.5–15.5)
WBC: 6.9 10*3/uL (ref 4.0–10.5)

## 2016-12-24 LAB — COMPREHENSIVE METABOLIC PANEL
ALBUMIN: 4.3 g/dL (ref 3.5–5.0)
ALK PHOS: 53 U/L (ref 38–126)
ALT: 16 U/L — ABNORMAL LOW (ref 17–63)
ANION GAP: 9 (ref 5–15)
AST: 25 U/L (ref 15–41)
BILIRUBIN TOTAL: 0.7 mg/dL (ref 0.3–1.2)
BUN: 26 mg/dL — AB (ref 6–20)
CALCIUM: 9.4 mg/dL (ref 8.9–10.3)
CO2: 29 mmol/L (ref 22–32)
Chloride: 101 mmol/L (ref 101–111)
Creatinine, Ser: 1.1 mg/dL (ref 0.61–1.24)
GFR calc Af Amer: >60 mL/min (ref >60)
GFR, EST NON AFRICAN AMERICAN: 53 mL/min — AB (ref >60)
GLUCOSE: 113 mg/dL — AB (ref 65–99)
Potassium: 4.1 mmol/L (ref 3.5–5.1)
Sodium: 139 mmol/L (ref 135–145)
TOTAL PROTEIN: 7.7 g/dL (ref 6.5–8.1)

## 2016-12-24 MED ORDER — ENOXAPARIN SODIUM 40 MG/0.4ML ~~LOC~~ SOLN
40.0000 mg | SUBCUTANEOUS | Status: DC
  Administered 2016-12-24 – 2016-12-26 (×3): 40 mg via SUBCUTANEOUS
  Filled 2016-12-24 (×3): qty 0.4

## 2016-12-24 MED ORDER — STROKE: EARLY STAGES OF RECOVERY BOOK
Freq: Once | Status: AC
  Administered 2016-12-24: 21:00:00
  Filled 2016-12-24: qty 1

## 2016-12-24 MED ORDER — METOPROLOL TARTRATE 50 MG PO TABS
50.0000 mg | ORAL_TABLET | Freq: Two times a day (BID) | ORAL | Status: DC
  Administered 2016-12-24 – 2016-12-27 (×6): 50 mg via ORAL
  Filled 2016-12-24 (×6): qty 1

## 2016-12-24 MED ORDER — ACETAMINOPHEN 325 MG PO TABS: 650.0000 mg | ORAL_TABLET | ORAL | Status: DC | PRN

## 2016-12-24 MED ORDER — SIMVASTATIN 20 MG PO TABS
40.0000 mg | ORAL_TABLET | Freq: Every day | ORAL | Status: DC
  Administered 2016-12-24 – 2016-12-26 (×3): 40 mg via ORAL
  Filled 2016-12-24 (×3): qty 2

## 2016-12-24 MED ORDER — ASPIRIN 81 MG PO TBEC: 81.0000 mg | DELAYED_RELEASE_TABLET | Freq: Every day | ORAL | Status: DC

## 2016-12-24 MED ORDER — BUSPIRONE HCL 5 MG PO TABS
7.5000 mg | ORAL_TABLET | Freq: Two times a day (BID) | ORAL | Status: DC
  Administered 2016-12-24 – 2016-12-27 (×6): 7.5 mg via ORAL
  Filled 2016-12-24 (×6): qty 2

## 2016-12-24 MED ORDER — SENNOSIDES 8.6 MG PO TABS: 1.0000 | ORAL_TABLET | Freq: Every day | ORAL | Status: DC

## 2016-12-24 MED ORDER — ASPIRIN EC 81 MG PO TBEC
81.0000 mg | DELAYED_RELEASE_TABLET | Freq: Every day | ORAL | Status: DC
  Administered 2016-12-24 – 2016-12-27 (×4): 81 mg via ORAL
  Filled 2016-12-24 (×4): qty 1

## 2016-12-24 MED ORDER — ACETAMINOPHEN 160 MG/5ML PO SOLN: 650.0000 mg | ORAL | Status: DC | PRN

## 2016-12-24 MED ORDER — ACETAMINOPHEN 650 MG RE SUPP: 650.0000 mg | RECTAL | Status: DC | PRN

## 2016-12-24 MED ORDER — SENNA 8.6 MG PO TABS
1.0000 | ORAL_TABLET | Freq: Every day | ORAL | Status: DC
  Administered 2016-12-24 – 2016-12-27 (×4): 8.6 mg via ORAL
  Filled 2016-12-24 (×4): qty 1

## 2016-12-24 MED ORDER — DOCUSATE SODIUM 100 MG PO CAPS: 100.0000 mg | ORAL_CAPSULE | Freq: Every day | ORAL | Status: DC | PRN

## 2016-12-24 MED ORDER — OMEGA-3-ACID ETHYL ESTERS 1 G PO CAPS
1.0000 g | ORAL_CAPSULE | Freq: Two times a day (BID) | ORAL | Status: DC
  Administered 2016-12-24 – 2016-12-27 (×6): 1 g via ORAL
  Filled 2016-12-24 (×6): qty 1

## 2016-12-24 MED ORDER — PANTOPRAZOLE SODIUM 40 MG PO TBEC
40.0000 mg | DELAYED_RELEASE_TABLET | Freq: Every day | ORAL | Status: DC
  Administered 2016-12-24 – 2016-12-27 (×4): 40 mg via ORAL
  Filled 2016-12-24 (×4): qty 1

## 2016-12-24 MED ORDER — SODIUM CHLORIDE 0.9 % IV SOLN
INTRAVENOUS | Status: DC
  Administered 2016-12-24: 10 mL/h via INTRAVENOUS

## 2016-12-24 MED ORDER — SENNOSIDES-DOCUSATE SODIUM 8.6-50 MG PO TABS: 1.0000 | ORAL_TABLET | Freq: Every evening | ORAL | Status: DC | PRN

## 2016-12-24 NOTE — ED Triage Notes (Signed)
Patient brought in via EMS from home. Alert, oriented to person and situation. Patient c/o generalized weakness/fatigue. Denies any fevers, cough, nausea, vomiting, diarrhea. Per patient has had frequent urination with no pain.

## 2016-12-24 NOTE — ED Provider Notes (Signed)
[facility] DEPT Provider Note   CSN: [id number] Arrival date & time: 12/24/16  1420     History   Chief Complaint Chief Complaint  Patient presents with  . Fatigue    HPI [name] [name] is a 83 y.o. male.  Patient was trying to get into a chair and slid onto the floor was unable to get up. He called his family who came to see him and called EMS. Patient has significant weakness in the lower right leg    Fall  This is a new problem. The current episode started 12 to 24 hours ago. The problem occurs constantly. The problem has not changed since onset.Pertinent negatives include no chest pain, no abdominal pain and no headaches. Nothing aggravates the symptoms. Nothing relieves the symptoms. He has tried nothing for the symptoms. The treatment provided no relief.    Past Medical History:  Diagnosis Date  . Atrial fibrillation ([place])   . BPH (benign prostatic hypertrophy)    hx  . CAD (coronary artery disease)   . Cataract   . Edema   . HLD (hyperlipidemia)    miixed  . Long term (current) use of anticoagulants    coumadin theraoy    Patient Active Problem List   Diagnosis Date Noted  . Stroke (cerebrum) ([place]) 12/24/2016  . Gait instability 02/05/2015  . Platelets decreased ([place]) 09/30/2014  . 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 Thrombocytopenia ([place]) 09/30/2014  . At high risk for falls 08/08/2014  . Frailty 05/21/2014  . Hyperlipidemia 03/19/2009  . CAD, UNSPECIFIED SITE 03/19/2009  . ATRIAL FIBRILLATION 03/19/2009  . EDEMA 03/19/2009  . BENIGN PROSTATIC HYPERTROPHY, HX OF 03/19/2009    Past Surgical History:  Procedure Laterality Date  . bilateral inguinal hernia repairs    . CARDIAC SURGERY    . CHOLECYSTECTOMY    . CORONARY ARTERY BYPASS GRAFT  1998   LIMA to LAD, SVG to diagonal, SVG to OM1, SVG to OM2, OM to PDA. had a cath in 2001 demonstratig grafts to be patent  . HERNIA REPAIR         Home Medications    Prior to Admission medications     Medication Sig Start Date End Date Taking? Authorizing Provider  ASPIRIN EC LO-DOSE 81 MG EC tablet TAKE 1 TABLET DAILY 08/12/16   Chipper Herb, MD  ASPIRIN EC LO-DOSE 81 MG EC tablet TAKE 1 TABLET DAILY 12/07/16   Chipper Herb, MD  busPIRone (BUSPAR) 15 MG tablet Take 1/2 tablet (7.5 mg total) by mouth 2 (two) times daily. 11/18/16   Chipper Herb, MD  Calcium Carbonate-Vitamin D 600-400 MG-UNIT per tablet Take 1 tablet by mouth daily.      Historical Provider, MD  desoximetasone (TOPICORT) 0.05 % cream Apply topically 2 (two) times daily. 05/21/14   Chipper Herb, MD  docusate sodium (COLACE) 100 MG capsule Take 100 mg by mouth as needed.      Historical Provider, MD  Fluocinonide Emulsified Base 0.05 % CREA Apply 1 application topically 2 (two) times daily. 11/06/14   Chipper Herb, MD  furosemide (LASIX) 40 MG tablet TAKE 1 TABLET ONCE DAILY AS NEEDED FOR SEVERE SWELLING 11/18/16   Chipper Herb, MD  isosorbide dinitrate (ISORDIL) 10 MG tablet TAKE (1) TABLET TWICE A DAY. 12/07/16   Chipper Herb, MD  lisinopril (PRINIVIL,ZESTRIL) 10 MG tablet TAKE 1 TABLET ONCE A DAY 10/25/16   Chipper Herb, MD  metoprolol (LOPRESSOR) 50 MG tablet  TAKE (1) TABLET TWICE A DAY. 10/25/16   Chipper Herb, MD  Omega-3 Fatty Acids (FISH OIL) 1000 MG CAPS Take 3 capsules by mouth daily. 1000 mg?     Historical Provider, MD  omeprazole (PRILOSEC) 20 MG capsule TAKE (1) CAPSULE DAILY 12/07/16   Chipper Herb, MD  senna (SENOKOT) 8.6 MG tablet Take 1 tablet by mouth daily. prn    Historical Provider, MD  simvastatin (ZOCOR) 40 MG tablet TAKE 1 TABLET IN THE EVENING FOR CHOLESTEROL 10/26/16   Chipper Herb, MD  triamcinolone cream (KENALOG) 0.1 % Pharmacy to mix 120g of trimacinolone 1% with 120g of eucerin Apply to legs BID 07/23/14   Chipper Herb, MD    Family History Family History  Problem Relation Age of Onset  . Hip fracture Mother   . Heart attack Father   . Cancer      fhx  . Coronary artery  disease      fhx  . Diabetes      fhx; also of alcoholism     Social History Social History  Substance Use Topics  . Smoking status: Never Smoker  . Smokeless tobacco: Never Used     Comment: tobacco use - no   . Alcohol use No     Allergies   Penicillins   Review of Systems Review of Systems  Constitutional: Negative for appetite change and fatigue.  HENT: Negative for congestion, ear discharge and sinus pressure.   Eyes: Negative for discharge.  Respiratory: Negative for cough.   Cardiovascular: Negative for chest pain.  Gastrointestinal: Negative for abdominal pain and diarrhea.  Genitourinary: Negative for frequency and hematuria.  Musculoskeletal: Negative for back pain.       Weakness right leg  Skin: Negative for rash.  Neurological: Negative for seizures and headaches.  Psychiatric/Behavioral: Negative for hallucinations.     Physical Exam Updated Vital Signs BP 134/77   Pulse 68   Temp 99.3 F (37.4 C) (Oral)   Resp 17   SpO2 95%   Physical Exam  Constitutional: He is oriented to person, place, and time. He appears well-developed.  HENT:  Head: Normocephalic.  Eyes: Conjunctivae and EOM are normal. No scleral icterus.  Neck: Neck supple. No thyromegaly present.  Cardiovascular: Normal rate and regular rhythm.  Exam reveals no gallop and no friction rub.   No murmur heard. Pulmonary/Chest: No stridor. He has no wheezes. He has no rales. He exhibits no tenderness.  Abdominal: He exhibits no distension. There is no tenderness. There is no rebound.  Musculoskeletal: Normal range of motion. He exhibits no edema.  Patient has moderate weakness to right leg but no pain. He is unable to ambulate secondary to the weakness in that leg  Lymphadenopathy:    He has no cervical adenopathy.  Neurological: He is oriented to person, place, and time. He exhibits normal muscle tone. Coordination normal.  Skin: No rash noted. No erythema.  Psychiatric: He has a  normal mood and affect. His behavior is normal.     ED Treatments / Results  Labs (all labs ordered are listed, but only abnormal results are displayed) Labs Reviewed  URINALYSIS, ROUTINE W REFLEX MICROSCOPIC - Abnormal; Notable for the following:       Result Value   Nitrite POSITIVE (*)    Leukocytes, UA SMALL (*)    Bacteria, UA FEW (*)    All other components within normal limits  CBC WITH DIFFERENTIAL/PLATELET - Abnormal; Notable for the following:  Platelets 139 (*)    Lymphs Abs 0.3 (*)    All other components within normal limits  COMPREHENSIVE METABOLIC PANEL - Abnormal; Notable for the following:    Glucose, Bld 113 (*)    BUN 26 (*)    ALT 16 (*)    GFR calc non Af Amer 53 (*)    All other components within normal limits    EKG  EKG Interpretation  Date/Time:  Saturday December 24 2016 15:32:55 EST Ventricular Rate:  95 PR Interval:    QRS Duration: 93 QT Interval:  350 QTC Calculation: 440 R Axis:   99 Text Interpretation:  Atrial fibrillation Right axis deviation Borderline repolarization abnormality Confirmed by Shresta Risden  MD, Terion Hedman [phone]) on 12/24/2016 5:44:49 PM       Radiology Dg Chest 1 View  Result Date: 12/24/2016 CLINICAL DATA:  83 year old male with history of weakness. EXAM: CHEST 1 VIEW COMPARISON:  Chest x-ray 09/14/2015. FINDINGS: Low lung volumes. Mild diffuse peribronchial cuffing. No acute consolidative airspace disease. No pleural effusions. No evidence of pulmonary edema. Heart size is mildly enlarged. The patient is rotated to the right on today's exam, resulting in distortion of the mediastinal contours and reduced diagnostic sensitivity and specificity for mediastinal pathology. Atherosclerosis in the thoracic aorta. Status post median sternotomy for CABG, including LIMA to the LAD. IMPRESSION: 1. Diffuse peribronchial cuffing, concerning for an acute bronchitis. 2. Cardiomegaly. 3. Aortic atherosclerosis. Electronically Signed   By: Vinnie Langton M.D.   On: 12/24/2016 16:20   Ct Head Wo Contrast  Result Date: 12/24/2016 CLINICAL DATA:  Weakness. EXAM: CT HEAD WITHOUT CONTRAST TECHNIQUE: Contiguous axial images were obtained from the base of the skull through the vertex without intravenous contrast. COMPARISON:  CT of the orbits November 21, 2014 FINDINGS: Brain: No subdural, epidural, or subarachnoid hemorrhage. Ventricles and sulci are prominent, likely due to age related atrophy. Cerebellum, brainstem, and basal cisterns are normal. Scattered white matter changes. No acute cortical ischemia or infarct identified. No mass, mass effect, or midline shift. Vascular: Dense calcification in the intracranial carotid arteries. Skull: Normal. Negative for fracture or focal lesion. Sinuses/Orbits: There is continued opacification left maxillary sinus with mixed attenuation material including high attenuation. There is expansion of the maxillary sinus medially. These findings are unchanged since 2015. There is now opacification of left-sided ethmoid air cells and the left frontal sinus, new in the interval. The remainder of the paranasal sinuses are well aerated. The middle ears and mastoid air cells are normal. Other: None. IMPRESSION: 1. No acute intracranial abnormality. 2. 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 New opacification of left ethmoid air cells and the frontal sinus. Electronically Signed   By: Dorise Bullion III M.D   On: 12/24/2016 16:21   Dg Hip Unilat W Or Wo Pelvis 2-3 Views Right  Result Date: 12/24/2016 CLINICAL DATA:  Weakness today. EXAM: DG HIP (WITH OR WITHOUT PELVIS) 2-3V RIGHT COMPARISON:  None. FINDINGS: No acute abnormality is identified. Mild degenerative change is present about the hips. No focal bony lesion. There is partial visualization of lower lumbar degenerative change. IMPRESSION: Negative exam. Electronically Signed   By: Inge Rise M.D.   On: 12/24/2016 16:20     Procedures Procedures (including critical care time)  Medications Ordered in ED Medications - No data to display   Initial Impression / Assessment and Plan / ED Course  I have reviewed the triage vital signs and the nursing notes.  Pertinent labs &  imaging results that were available during my care of the patient were reviewed by me and considered in my medical decision making (see chart for details).  Clinical Course     Fall with weakness to the leg. Suspect stroke. Patient will be admitted for stroke workup  Final Clinical Impressions(s) / ED Diagnoses   Final diagnoses:  Weakness    New Prescriptions New Prescriptions   No medications on file     Milton Ferguson, MD 12/24/16 [phone]

## 2016-12-24 NOTE — ED Triage Notes (Signed)
Per EMS pt tried to get up to go to the bathroom and got weak in the knees and legs.  EMS tried to get him up with assistance and he was still weak.  Vital signs are within normal limits.  Family states that this is not his baseline.  Pt is from home and resides by himself.

## 2016-12-24 NOTE — H&P (Signed)
History and Physical  [name] [name] [id number] DOB: [birth year] DOA: 12/24/2016  Referring physician: Dr Dewayne Hatch, ED physician PCP: Redge Gainer, MD  Outpatient Specialists:   Dr Percival Spanish (Cardiology)  Chief Complaint: Right leg weakness  HPI: [name] [name] is a 83 y.o. male with a history of rate controlled atrial fibrillation not on anticoagulation due to fall risk, hypertension, coronary artery disease, BPH, possible senile dementia. Patient was reported to be normal yesterday. The patient tried to walk today and had difficulty maintaining weight on right leg. No palliating or provoking factors. Denies fevers, chills, nausea, vomiting. Appetite is good. Patient tried to get up to go to the bathroom, but got weak in the knees and legs. With the ER physician, the patient was unable to bear weight on his right leg. No pain, only weakness.  Emergency Department Course: CT of head is normal. X-ray of right hip normal. Labs unremarkable.  Review of Systems:   Pt denies any fevers, chills, nausea, vomiting, diarrhea, constipation, abdominal pain, shortness of breath, dyspnea on exertion, orthopnea, cough, wheezing, palpitations, headache, vision changes, lightheadedness, dizziness, melena, rectal bleeding.  Review of systems are otherwise negative  Past Medical History:  Diagnosis Date  . Atrial fibrillation ([place])   . BPH (benign prostatic hypertrophy)    hx  . CAD (coronary artery disease)   . Cataract   . Edema   . HLD (hyperlipidemia)    miixed  . Long term (current) use of anticoagulants    coumadin theraoy   Past Surgical History:  Procedure Laterality Date  . bilateral inguinal hernia repairs    . CARDIAC SURGERY    . CHOLECYSTECTOMY    . CORONARY ARTERY BYPASS GRAFT  1998   LIMA to LAD, SVG to diagonal, SVG to OM1, SVG to OM2, OM to PDA. had a cath in 2001 demonstratig grafts to be patent  . HERNIA REPAIR     Social History:  reports that he has never smoked. He  has never used smokeless tobacco. He reports that he does not drink alcohol or use drugs. Patient lives at [facility]  . Penicillins Other (See Comments)    Reaction is unknown    Family History  Problem Relation Age of Onset  . Hip fracture Mother   . Heart attack Father   . Cancer      fhx  . Coronary artery disease      fhx  . Diabetes      fhx; also of alcoholism       Prior to Admission medications   Medication Sig Start Date End Date Taking? Authorizing Provider  ASPIRIN EC LO-DOSE 81 MG EC tablet TAKE 1 TABLET DAILY 08/12/16   Chipper Herb, MD  ASPIRIN EC LO-DOSE 81 MG EC tablet TAKE 1 TABLET DAILY 12/07/16   Chipper Herb, MD  busPIRone (BUSPAR) 15 MG tablet Take 1/2 tablet (7.5 mg total) by mouth 2 (two) times daily. 11/18/16   Chipper Herb, MD  Calcium Carbonate-Vitamin D 600-400 MG-UNIT per tablet Take 1 tablet by mouth daily.      Historical Provider, MD  desoximetasone (TOPICORT) 0.05 % cream Apply topically 2 (two) times daily. 05/21/14   Chipper Herb, MD  docusate sodium (COLACE) 100 MG capsule Take 100 mg by mouth as needed.      Historical Provider, MD  Fluocinonide Emulsified Base 0.05 % CREA Apply 1 application topically 2 (two) times daily. 11/06/14   Estella Husk  Laurance Flatten, MD  furosemide (LASIX) 40 MG tablet TAKE 1 TABLET ONCE DAILY AS NEEDED FOR SEVERE SWELLING 11/18/16   Chipper Herb, MD  isosorbide dinitrate (ISORDIL) 10 MG tablet TAKE (1) TABLET TWICE A DAY. 12/07/16   Chipper Herb, MD  lisinopril (PRINIVIL,ZESTRIL) 10 MG tablet TAKE 1 TABLET ONCE A DAY 10/25/16   Chipper Herb, MD  metoprolol (LOPRESSOR) 50 MG tablet TAKE (1) TABLET TWICE A DAY. 10/25/16   Chipper Herb, MD  Omega-3 Fatty Acids (FISH OIL) 1000 MG CAPS Take 3 capsules by mouth daily. 1000 mg?     Historical Provider, MD  omeprazole (PRILOSEC) 20 MG capsule TAKE (1) CAPSULE DAILY 12/07/16   Chipper Herb, MD  senna (SENOKOT) 8.6 MG tablet Take 1 tablet by mouth  daily. prn    Historical Provider, MD  simvastatin (ZOCOR) 40 MG tablet TAKE 1 TABLET IN THE EVENING FOR CHOLESTEROL 10/26/16   Chipper Herb, MD  triamcinolone cream (KENALOG) 0.1 % Pharmacy to mix 120g of trimacinolone 1% with 120g of eucerin Apply to legs BID 07/23/14   Chipper Herb, MD    Physical Exam: BP 134/77   Pulse 68   Temp 99.3 F (37.4 C) (Oral)   Resp 17   SpO2 95%   General: Elderly Caucasian male. Awake and alert and oriented x3. No acute cardiopulmonary distress.  HEENT: Normocephalic atraumatic.  Right and left ears normal in appearance.  Pupils equal, round, reactive to light. Extraocular muscles are intact. Sclerae anicteric and noninjected.  Moist mucosal membranes. No mucosal lesions.  Neck: Neck supple without lymphadenopathy. No carotid bruits. No masses palpated.  Cardiovascular: Regular rate with normal S1-S2 sounds. No murmurs, rubs, gallops auscultated. No JVD.  Respiratory: Good respiratory effort with no wheezes, rales, rhonchi. Lungs clear to auscultation bilaterally.  No accessory muscle use. Abdomen: Soft, nontender, nondistended. Active bowel sounds. No masses or hepatosplenomegaly  Skin: No rashes, lesions, or ulcerations.  Dry, warm to touch. 2+ dorsalis pedis and radial pulses. Musculoskeletal: No calf or leg pain. All major joints not erythematous nontender.  No upper or lower joint deformation.  Good ROM.  No contractures  Psychiatric: Intact judgment and insight. Pleasant and cooperative. Neurologic: No focal neurological deficits. Strength in lower extremities 5 out of 5 bilaterally and symmetric. Upper extremity strength also 5 out of 5. Coordination intact. Cranial nerves II through XII are grossly intact.           Labs on Admission: I have personally reviewed following labs and imaging studies  CBC:  Recent Labs Lab 12/24/16 1542  WBC 6.9  NEUTROABS 6.4  HGB 14.0  HCT 43.7  MCV 93.8  PLT [id number]*   Basic Metabolic Panel:  Recent  Labs Lab 12/24/16 1542  NA 139  K 4.1  CL 101  CO2 29  GLUCOSE 113*  BUN 26*  CREATININE 1.10  CALCIUM 9.4   GFR: CrCl cannot be calculated (Unknown ideal weight.). Liver Function Tests:  Recent Labs Lab 12/24/16 1542  AST 25  ALT 16*  ALKPHOS 53  BILITOT 0.7  PROT 7.7  ALBUMIN 4.3   No results for input(s): LIPASE, AMYLASE in the last 168 hours. No results for input(s): AMMONIA in the last 168 hours. Coagulation Profile: No results for input(s): INR, PROTIME in the last 168 hours. Cardiac Enzymes: No results for input(s): CKTOTAL, CKMB, CKMBINDEX, TROPONINI in the last 168 hours. BNP (last 3 results) No results for input(s): PROBNP in the last 8760 hours.  HbA1C: No results for input(s): HGBA1C in the last 72 hours. CBG: No results for input(s): GLUCAP in the last 168 hours. Lipid Profile: No results for input(s): CHOL, HDL, LDLCALC, TRIG, CHOLHDL, LDLDIRECT in the last 72 hours. Thyroid Function Tests: No results for input(s): TSH, T4TOTAL, FREET4, T3FREE, THYROIDAB in the last 72 hours. Anemia Panel: No results for input(s): VITAMINB12, FOLATE, FERRITIN, TIBC, IRON, RETICCTPCT in the last 72 hours. Urine analysis:    Component Value Date/Time   COLORURINE YELLOW 12/24/2016 1430   APPEARANCEUR CLEAR 12/24/2016 1430   LABSPEC 1.010 12/24/2016 1430   PHURINE 5.0 12/24/2016 1430   GLUCOSEU NEGATIVE 12/24/2016 1430   HGBUR NEGATIVE 12/24/2016 1430   BILIRUBINUR NEGATIVE 12/24/2016 1430   BILIRUBINUR neg 03/13/2013 1411   KETONESUR NEGATIVE 12/24/2016 1430   PROTEINUR NEGATIVE 12/24/2016 1430   UROBILINOGEN negative 03/13/2013 1411   NITRITE POSITIVE (A) 12/24/2016 1430   LEUKOCYTESUR SMALL (A) 12/24/2016 1430   Sepsis Labs: @LABRCNTIP (procalcitonin:4,lacticidven:4) )No results found for this or any previous visit (from the past 240 hour(s)).   Radiological Exams on Admission: Dg Chest 1 View  Result Date: 12/24/2016 CLINICAL DATA:  83 year old male with  history of weakness. EXAM: CHEST 1 VIEW COMPARISON:  Chest x-ray 09/14/2015. FINDINGS: Low lung volumes. Mild diffuse peribronchial cuffing. No acute consolidative airspace disease. No pleural effusions. No evidence of pulmonary edema. Heart size is mildly enlarged. The patient is rotated to the right on today's exam, resulting in distortion of the mediastinal contours and reduced diagnostic sensitivity and specificity for mediastinal pathology. Atherosclerosis in the thoracic aorta. Status post median sternotomy for CABG, including LIMA to the LAD. IMPRESSION: 1. Diffuse peribronchial cuffing, concerning for an acute bronchitis. 2. Cardiomegaly. 3. Aortic atherosclerosis. Electronically Signed   By: Vinnie Langton M.D.   On: 12/24/2016 16:20   Ct Head Wo Contrast  Result Date: 12/24/2016 CLINICAL DATA:  Weakness. EXAM: CT HEAD WITHOUT CONTRAST TECHNIQUE: Contiguous axial images were obtained from the base of the skull through the vertex without intravenous contrast. COMPARISON:  CT of the orbits November 21, 2014 FINDINGS: Brain: No subdural, epidural, or subarachnoid hemorrhage. Ventricles and sulci are prominent, likely due to age related atrophy. Cerebellum, brainstem, and basal cisterns are normal. Scattered white matter changes. No acute cortical ischemia or infarct identified. No mass, mass effect, or midline shift. Vascular: Dense calcification in the intracranial carotid arteries. Skull: Normal. Negative for fracture or focal lesion. Sinuses/Orbits: There is continued opacification left maxillary sinus with mixed attenuation material including high attenuation. There is expansion of the maxillary sinus medially. These findings are unchanged since 2015. There is now opacification of left-sided ethmoid air cells and the left frontal sinus, new in the interval. The remainder of the paranasal sinuses are well aerated. The middle ears and mastoid air cells are normal. Other: None. IMPRESSION: 1. No acute  intracranial abnormality. 2. 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 New opacification of left ethmoid air cells and the frontal sinus. Electronically Signed   By: Dorise Bullion III M.D   On: 12/24/2016 16:21   Dg Hip Unilat W Or Wo Pelvis 2-3 Views Right  Result Date: 12/24/2016 CLINICAL DATA:  Weakness today. EXAM: DG HIP (WITH OR WITHOUT PELVIS) 2-3V RIGHT COMPARISON:  None. FINDINGS: No acute abnormality is identified. Mild degenerative change is present about the hips. No focal bony lesion. There is partial visualization of lower lumbar degenerative change. IMPRESSION: Negative exam. Electronically Signed   By: Inge Rise M.D.  On: 12/24/2016 16:20    EKG: Independently reviewed. Atrial fibrillation. Right axis deviation.  Assessment/Plan: Active Problems:   Coronary atherosclerosis   ATRIAL FIBRILLATION   Gait instability   Stroke (cerebrum) [facility])    This patient was discussed with the ED physician, including pertinent vitals, physical exam findings, labs, and imaging.  We also discussed care given by the ED provider.  #1 possible stroke  Admit  MRI/MRA  Echo  Carotid Dopplers  Fasting lipid panel and hemoglobin A1c tomorrow  Allow for permissive hypertension, treat if systolic blood pressure is above 180.  PT/OT/speech therapy results #2 atrial fib ablation  Rate controlled  Continue metoprolol for rate control #3 gait instability #4 coronary artery disease  Continue lipid therapy  DVT prophylaxis: Lovenox Consultants: None Code Status: DO NOT RESUSCITATE Family Communication: Daughters in the room  Disposition Plan: Pending   Truett Mainland, DO [facility] Pager [phone]  If 7PM-7AM, please contact night-coverage [email or web address] Password [email or web address]

## 2016-12-25 ENCOUNTER — Other Ambulatory Visit (HOSPITAL_COMMUNITY): Payer: Medicare Other

## 2016-12-25 ENCOUNTER — Inpatient Hospital Stay (HOSPITAL_COMMUNITY): Payer: Medicare Other

## 2016-12-25 DIAGNOSIS — R2681 Unsteadiness on feet: Secondary | ICD-10-CM | POA: Diagnosis not present

## 2016-12-25 DIAGNOSIS — Z833 Family history of diabetes mellitus: Secondary | ICD-10-CM | POA: Diagnosis not present

## 2016-12-25 DIAGNOSIS — R262 Difficulty in walking, not elsewhere classified: Secondary | ICD-10-CM | POA: Diagnosis not present

## 2016-12-25 DIAGNOSIS — I482 Chronic atrial fibrillation: Secondary | ICD-10-CM

## 2016-12-25 DIAGNOSIS — R531 Weakness: Secondary | ICD-10-CM | POA: Diagnosis not present

## 2016-12-25 LAB — TSH: TSH: 2.49 u[IU]/mL (ref 0.350–4.500)

## 2016-12-25 LAB — LIPID PANEL
CHOL/HDL RATIO: 2.5 ratio
CHOLESTEROL: 104 mg/dL (ref 0–200)
HDL: 42 mg/dL (ref >40)
LDL Cholesterol: 52 mg/dL (ref 0–99)
TRIGLYCERIDES: 52 mg/dL (ref <150)
VLDL: 10 mg/dL (ref 0–40)

## 2016-12-25 NOTE — Evaluation (Addendum)
Physical Therapy Evaluation Patient Details Name: [name] [name] MRN: [id number] DOB: [birth year] Today's Date: 12/25/2016   History of Present Illness  [name] [name] is a 83 y.o. male with a history of rate controlled atrial fibrillation not on anticoagulation due to fall risk, hypertension, coronary artery disease, BPH, possible senile dementia. Patient was reported to be normal yesterday. The patient tried to walk today and had difficulty maintaining weight on right leg. No palliating or provoking factors. Denies fevers, chills, nausea, vomiting. Appetite is good. Patient tried to get up to go to the bathroom, but got weak in the knees and legs. With the ER physician, the patient was unable to bear weight on his right leg. No pain, only weakness  Clinical Impression  Pt is able to bear weight on Rt LE at this time.  He is ambulatory with a rolling walker with min guard assist as his right leg becomes tired.  Pt normally lives alone with an aide and the assistance of his family.  He has 6 steps to go in and out of his house.  At this time I am recommending skilled nursing facility for continued rehabilitation to improve [name] [name] strength and balance prior to going home.     Follow Up Recommendations SNF    Equipment Recommendations  None recommended by PT    Recommendations for Other Services       Precautions / Restrictions Precautions Precautions: Fall Restrictions Weight Bearing Restrictions: No      Mobility  Bed Mobility Overal bed mobility: Modified Independent                Transfers Overall transfer level: Needs assistance   Transfers: Sit to/from Stand Sit to Stand: Mod assist            Ambulation/Gait Ambulation/Gait assistance: Min guard Ambulation Distance (Feet): 30 Feet Assistive device: Rolling walker (2 wheeled) Gait Pattern/deviations: Decreased step length - right;Decreased step length - left   Gait velocity interpretation: Below normal  speed for age/gender General Gait Details: PT states that right hip is "wearing out"  Stairs                      Pertinent Vitals/Pain Pain Assessment: No/denies pain    Home Living Family/patient expects to be discharged to:: Skilled nursing facility                      Prior Function Level of Independence: Needs assistance   Gait / Transfers Assistance Needed: with rollling walker  ADL's / Homemaking Assistance Needed: family provides         Hand Dominance        Extremity/Trunk Assessment        Lower Extremity Assessment Lower Extremity Assessment: RLE deficits/detail RLE Deficits / Details: hip generally 3-/5; knee 3-/5 ankle 3-/5       Communication   Communication: No difficulties  Cognition Arousal/Alertness: Awake/alert Behavior During Therapy: WFL for tasks assessed/performed Overall Cognitive Status: History of cognitive impairments - at baseline                      General Comments      Exercises General Exercises - Lower Extremity Ankle Circles/Pumps: Both;10 reps Quad Sets: Both;10 reps Hip ABduction/ADduction: Both;5 reps Straight Leg Raises: Both;5 reps   Assessment/Plan    PT Assessment Patient needs continued PT services  PT Problem List Decreased strength;Decreased activity tolerance;Decreased balance;Decreased mobility  PT Treatment Interventions Gait training;Functional mobility training;Therapeutic activities;Therapeutic exercise;Balance training;Neuromuscular re-education    PT Goals (Current goals can be found in the Care Plan section)  Acute Rehab PT Goals PT Goal Formulation: With family Time For Goal Achievement: 12/27/16 Potential to Achieve Goals: Good    Frequency Min 5X/week           End of Session Equipment Utilized During Treatment: Gait belt Activity Tolerance: Patient tolerated treatment well Patient left: in chair;with chair alarm set;with family/visitor present            Time: 1225-1301 PT Time Calculation (min) (ACUTE ONLY): 36 min   Charges:   PT Evaluation $PT Eval Low Complexity: 1 Procedure     PT G Codes:   [id number]  CL                           [id number]  CL                           [id number]  Lee Vining, PT CLT [phone] 12/25/2016, 1:01 PM

## 2016-12-25 NOTE — Progress Notes (Signed)
PROGRESS NOTE    [name] [name]  [id number] DOB: 30-Apr-1915 DOA: 12/24/2016 PCP: Redge Gainer, MD   Brief Narrative: 83 year old male with history of atrial fibrillation not on anticoagulation because of fall risk, hypertension, coronary artery disease, BPH, possible Alzheimer's dementia presented with right leg weakness. In the ER CT scan of head no acute finding. X-ray of right hip unremarkable. Admitted for further evaluation.  Assessment & Plan:   #Generalized weakness including lower extremities weakness: -I believe this is patient's physical deconditioning. No evidence of a stroke. -MRI and MRA of brain with no acute finding. -Carotid ultrasound showed no hemodynamically significant stenosis. -Pending echocardiogram -I will check thyroid function test -PT, OT and social worker evaluation for possible SNF on discharge. -LDL 52, pending A1c level.  #Chronic atrial fibrillation: Heart rate controlled. Not on anticoagulation because of fall risk. On metoprolol.  #Gait instability: Likely due to generalized weakness and physical deconditioning. Continue to have  #History of coronary artery disease: Continue aspirin, metoprolol and simvastatin.  #Mild thrombocytopenia: No sign of bleeding. Continue to monitor.  #Goals of care: Patient is DNR/DNI. Patient likely has Alzheimer's dementia without behavioral disturbance. Continue supportive care.  DVT prophylaxis: Lovenox subcutaneous Code Status: DO NOT RESUSCITATE Family Communication: No family present at bedside Disposition Plan: Likely discharge to nursing home in 1-2 days.  Consultants:   None  Procedures: MRI/MRA, carotid Doppler, echo pending Antimicrobials: None  Subjective: Patient was seen and examined at bedside. Patient denied pain, nausea, vomiting, chest pain, shortness of breath. Denied headache. Review of systems Limited because of his dementia.   Objective: Vitals:   12/25/16 0400 12/25/16 0700  12/25/16 1100 12/25/16 1304  BP: 130/60 133/66 (!) 103/50 128/71  Pulse: 82 85 66 76  Resp: 18 18 18 18   Temp: 98 F (36.7 C) 98.1 F (36.7 C) 98.2 F (36.8 C) 98.5 F (36.9 C)  TempSrc: Oral Oral Oral Oral  SpO2: 96% 96% 94% 95%  Weight:      Height:        Intake/Output Summary (Last 24 hours) at 12/25/16 1315 Last data filed at 12/25/16 1200  Gross per 24 hour  Intake            420.5 ml  Output              900 ml  Net           -479.5 ml   Filed Weights   12/24/16 2000 12/24/16 2017  Weight: 72.4 kg (159 lb 9.8 oz) 73.5 kg (162 lb 0.6 oz)    Examination:  General exam: Pleasant elderly male lying in bed, not in distress Respiratory system: Clear to auscultation. Respiratory effort normal. No wheezing or crackle Cardiovascular system: S1 & S2 heard, regular.  No pedal edema. Gastrointestinal system: Abdomen is nondistended, soft and nontender. Normal bowel sounds heard. Central nervous system: Alert, awake and following simple commands. Oriented to himself and hospital.. Extremities: Symmetric 5 x 5 power. Skin: No rashes, lesions or ulcers Psychiatry: Judgement and insight appear impaired. Mood & affect flat     Data Reviewed: I have personally reviewed following labs and imaging studies  CBC:  Recent Labs Lab 12/24/16 1542  WBC 6.9  NEUTROABS 6.4  HGB 14.0  HCT 43.7  MCV 93.8  PLT [id number]*   Basic Metabolic Panel:  Recent Labs Lab 12/24/16 1542  NA 139  K 4.1  CL 101  CO2 29  GLUCOSE 113*  BUN 26*  CREATININE 1.10  CALCIUM 9.4   GFR: Estimated Creatinine Clearance: 35.4 mL/min (by C-G formula based on SCr of 1.1 mg/dL). Liver Function Tests:  Recent Labs Lab 12/24/16 1542  AST 25  ALT 16*  ALKPHOS 53  BILITOT 0.7  PROT 7.7  ALBUMIN 4.3   No results for input(s): LIPASE, AMYLASE in the last 168 hours. No results for input(s): AMMONIA in the last 168 hours. Coagulation Profile: No results for input(s): INR, PROTIME in the last 168  hours. Cardiac Enzymes: No results for input(s): CKTOTAL, CKMB, CKMBINDEX, TROPONINI in the last 168 hours. BNP (last 3 results) No results for input(s): PROBNP in the last 8760 hours. HbA1C: No results for input(s): HGBA1C in the last 72 hours. CBG: No results for input(s): GLUCAP in the last 168 hours. Lipid Profile:  Recent Labs  12/25/16 0552  CHOL 104  HDL 42  LDLCALC 52  TRIG 52  CHOLHDL 2.5   Thyroid Function Tests: No results for input(s): TSH, T4TOTAL, FREET4, T3FREE, THYROIDAB in the last 72 hours. Anemia Panel: No results for input(s): VITAMINB12, FOLATE, FERRITIN, TIBC, IRON, RETICCTPCT in the last 72 hours. Sepsis Labs: No results for input(s): PROCALCITON, LATICACIDVEN in the last 168 hours.  No results found for this or any previous visit (from the past 240 hour(s)).       Radiology Studies: Dg Chest 1 View  Result Date: 12/24/2016 CLINICAL DATA:  83 year old male with history of weakness. EXAM: CHEST 1 VIEW COMPARISON:  Chest x-ray 09/14/2015. FINDINGS: Low lung volumes. Mild diffuse peribronchial cuffing. No acute consolidative airspace disease. No pleural effusions. No evidence of pulmonary edema. Heart size is mildly enlarged. The patient is rotated to the right on today's exam, resulting in distortion of the mediastinal contours and reduced diagnostic sensitivity and specificity for mediastinal pathology. Atherosclerosis in the thoracic aorta. Status post median sternotomy for CABG, including LIMA to the LAD. IMPRESSION: 1. Diffuse peribronchial cuffing, concerning for an acute bronchitis. 2. Cardiomegaly. 3. Aortic atherosclerosis. Electronically Signed   By: Vinnie Langton M.D.   On: 12/24/2016 16:20   Ct Head Wo Contrast  Result Date: 12/24/2016 CLINICAL DATA:  Weakness. EXAM: CT HEAD WITHOUT CONTRAST TECHNIQUE: Contiguous axial images were obtained from the base of the skull through the vertex without intravenous contrast. COMPARISON:  CT of the orbits  November 21, 2014 FINDINGS: Brain: No subdural, epidural, or subarachnoid hemorrhage. Ventricles and sulci are prominent, likely due to age related atrophy. Cerebellum, brainstem, and basal cisterns are normal. Scattered white matter changes. No acute cortical ischemia or infarct identified. No mass, mass effect, or midline shift. Vascular: Dense calcification in the intracranial carotid arteries. Skull: Normal. Negative for fracture or focal lesion. Sinuses/Orbits: There is continued opacification left maxillary sinus with mixed attenuation material including high attenuation. There is expansion of the maxillary sinus medially. These findings are unchanged since 2015. There is now opacification of left-sided ethmoid air cells and the left frontal sinus, new in the interval. The remainder of the paranasal sinuses are well aerated. The middle ears and mastoid air cells are normal. Other: None. IMPRESSION: 1. No acute intracranial abnormality. 2. 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 New opacification of left ethmoid air cells and the frontal sinus. Electronically Signed   By: Dorise Bullion III M.D   On: 12/24/2016 16:21   Mr Brain Wo Contrast  Result Date: 12/25/2016 CLINICAL DATA:  Weakness.  Concern for stroke. EXAM: MRI HEAD WITHOUT CONTRAST MRA HEAD WITHOUT CONTRAST TECHNIQUE: Multiplanar, multiecho pulse sequences  of the brain and surrounding structures were obtained without intravenous contrast. Angiographic images of the head were obtained using MRA technique without contrast. COMPARISON:  Head CT from yesterday. FINDINGS: MRI HEAD FINDINGS Brain: No acute infarct, hemorrhage, hydrocephalus, or mass. There is advanced atrophy. Prominent FLAIR hyperintensity around the temporal horns the lateral ventricles is likely gliosis. No swelling to suggest that this is edema or inflammation. Rather, the temporal horns of the lateral ventricles are markedly dilated from advanced mesial  temporal volume loss, finding which can be seen with Alzheimer's disease in the appropriate clinical setting. Small-vessel ischemic gliosis in the cerebral white matter is overall mild for age. 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 No chronic blood products. Vascular: Preserved flow voids.  Arterial findings below Skull and upper cervical spine: Facet arthropathy in the visible cervical spine. No marrow lesion noted. Sinuses/Orbits: There is near complete opacification of left maxillary, anterior ethmoid, and frontal sinuses due to middle meatus obstruction. Left maxillary sinus opacification is from mucosal thickening and hypointense inspissated secretions or fungal elements which medially bow the medial wall. No evidence of orbital intracranial extension. No acute orbital finding. Left cataract resection. Other: Subcentimeter probable cyst in the left parotid region, considered incidental MRA HEAD FINDINGS Dominant left vertebral artery. Fetal type PCA on the right. Anterior communicating artery. No visible left posterior communicating artery. No major branch occlusion or flow limiting stenosis. Apparent narrowing is at the bilateral M2 branches is likely from artifact. Negative for aneurysm. IMPRESSION: 1. No acute finding. 2. Atrophy, including prominent mesial temporal volume loss. If there is dementia, Alzheimer's disease could have this pattern. 3. Mild for age chronic microvascular ischemia. 4. Chronic sinusitis from left middle meatus obstruction, as described. Electronically Signed   By: Monte Fantasia M.D.   On: 12/25/2016 12:00   US Carotid Bilateral (at [facility] And [facility] Only)  Result Date: 12/25/2016 CLINICAL DATA:  Acute stroke symptoms, hyperlipidemia, tobacco use EXAM: BILATERAL CAROTID DUPLEX ULTRASOUND TECHNIQUE: Pearline Cables scale imaging, color Doppler and duplex ultrasound were performed of bilateral carotid and vertebral arteries in the neck. COMPARISON:  12/24/2016 CT FINDINGS: Criteria:  Quantification of carotid stenosis is based on velocity parameters that correlate the residual internal carotid diameter with NASCET-based stenosis levels, using the diameter of the distal internal carotid lumen as the denominator for stenosis measurement. The following velocity measurements were obtained: RIGHT ICA:  113/9 cm/sec CCA:  [id number] cm/sec SYSTOLIC ICA/CCA RATIO:  1.9 DIASTOLIC ICA/CCA RATIO:  1.1 ECA:  115 cm/sec LEFT ICA:  107/10 cm/sec CCA:  [id number] cm/sec SYSTOLIC ICA/CCA RATIO:  1.8 DIASTOLIC ICA/CCA RATIO:  2.2 ECA:  113 cm/sec RIGHT CAROTID ARTERY: Moderate heterogeneous and partially calcified right carotid bifurcation atherosclerosis extends into the proximal ICA. Ulcerated plaque formation suspected of the right carotid bifurcation. Despite this, no hemodynamically significant right ICA stenosis, velocity elevation, or turbulent flow. Degree of narrowing less than 50%. RIGHT VERTEBRAL ARTERY:  Antegrade LEFT CAROTID ARTERY: Similar moderate heterogeneous and partially calcified left carotid bifurcation atherosclerosis. Despite this, no hemodynamically significant left ICA stenosis, velocity elevation, or turbulent flow. LEFT VERTEBRAL ARTERY:  Antegrade IMPRESSION: Moderate bilateral carotid atherosclerosis as above. No hemodynamically significant ICA stenosis. Degree of narrowing less than 50% bilaterally. Patent antegrade vertebral flow bilaterally Electronically Signed   By: Jerilynn Mages.  Shick M.D.   On: 12/25/2016 11:00   Mr Jodene Nam Head/brain [id number] Cm  Result Date: 12/25/2016 CLINICAL DATA:  Weakness.  Concern for stroke. EXAM: MRI HEAD WITHOUT CONTRAST MRA HEAD WITHOUT CONTRAST TECHNIQUE:  Multiplanar, multiecho pulse sequences of the brain and surrounding structures were obtained without intravenous contrast. Angiographic images of the head were obtained using MRA technique without contrast. COMPARISON:  Head CT from yesterday. FINDINGS: MRI HEAD FINDINGS Brain: No acute infarct, hemorrhage, hydrocephalus,  or mass. There is advanced atrophy. Prominent FLAIR hyperintensity around the temporal horns the lateral ventricles is likely gliosis. No swelling to suggest that this is edema or inflammation. Rather, the temporal horns of the lateral ventricles are markedly dilated from advanced mesial temporal volume loss, finding which can be seen with Alzheimer's disease in the appropriate clinical setting. Small-vessel ischemic gliosis in the cerebral white matter is overall mild for age. 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 No chronic blood products. Vascular: Preserved flow voids.  Arterial findings below Skull and upper cervical spine: Facet arthropathy in the visible cervical spine. No marrow lesion noted. Sinuses/Orbits: There is near complete opacification of left maxillary, anterior ethmoid, and frontal sinuses due to middle meatus obstruction. Left maxillary sinus opacification is from mucosal thickening and hypointense inspissated secretions or fungal elements which medially bow the medial wall. No evidence of orbital intracranial extension. No acute orbital finding. Left cataract resection. Other: Subcentimeter probable cyst in the left parotid region, considered incidental MRA HEAD FINDINGS Dominant left vertebral artery. Fetal type PCA on the right. Anterior communicating artery. No visible left posterior communicating artery. No major branch occlusion or flow limiting stenosis. Apparent narrowing is at the bilateral M2 branches is likely from artifact. Negative for aneurysm. IMPRESSION: 1. No acute finding. 2. Atrophy, including prominent mesial temporal volume loss. If there is dementia, Alzheimer's disease could have this pattern. 3. Mild for age chronic microvascular ischemia. 4. Chronic sinusitis from left middle meatus obstruction, as described. Electronically Signed   By: Monte Fantasia M.D.   On: 12/25/2016 12:00   Dg Hip Unilat W Or Wo Pelvis 2-3 Views Right  Result Date: 12/24/2016 CLINICAL  DATA:  Weakness today. EXAM: DG HIP (WITH OR WITHOUT PELVIS) 2-3V RIGHT COMPARISON:  None. FINDINGS: No acute abnormality is identified. Mild degenerative change is present about the hips. No focal bony lesion. There is partial visualization of lower lumbar degenerative change. IMPRESSION: Negative exam. Electronically Signed   By: Inge Rise M.D.   On: 12/24/2016 16:20        Scheduled Meds: . aspirin EC  81 mg Oral Daily  . busPIRone  7.5 mg Oral BID  . enoxaparin (LOVENOX) injection  40 mg Subcutaneous Q24H  . metoprolol  50 mg Oral BID  . omega-3 acid ethyl esters  1 g Oral BID  . pantoprazole  40 mg Oral Daily  . senna  1 tablet Oral Daily  . simvastatin  40 mg Oral q1800   Continuous Infusions: . sodium chloride 10 mL/hr (12/24/16 2101)     LOS: 1 day    Soleil Mas Tanna Furry, MD [facility] Pager [phone]  If 7PM-7AM, please contact night-coverage [email or web address] Password [email or web address] 12/25/2016, 1:15 PM

## 2016-12-25 NOTE — Progress Notes (Signed)
POA, Pattie Hopper/ niece, wants to talk with case management about possible SNF placement of patient on 12/26/16. Phone # [phone] OR [phone].

## 2016-12-26 ENCOUNTER — Inpatient Hospital Stay (HOSPITAL_COMMUNITY): Payer: Medicare Other

## 2016-12-26 DIAGNOSIS — I6789 Other cerebrovascular disease: Secondary | ICD-10-CM

## 2016-12-26 DIAGNOSIS — Z833 Family history of diabetes mellitus: Secondary | ICD-10-CM | POA: Diagnosis not present

## 2016-12-26 LAB — ECHOCARDIOGRAM COMPLETE
HEIGHTINCHES: 68 in
Weight: 2592.61 oz

## 2016-12-26 LAB — HEMOGLOBIN A1C
HEMOGLOBIN A1C: 5.7 % — AB (ref 4.8–5.6)
MEAN PLASMA GLUCOSE: 117 mg/dL

## 2016-12-26 MED ORDER — LORAZEPAM 1 MG PO TABS
1.0000 mg | ORAL_TABLET | Freq: Once | ORAL | Status: AC
Start: 2016-12-26 — End: 2016-12-26
  Administered 2016-12-26: 1 mg via ORAL
  Filled 2016-12-26: qty 1

## 2016-12-26 MED ORDER — HALOPERIDOL LACTATE 5 MG/ML IJ SOLN
1.0000 mg | Freq: Once | INTRAMUSCULAR | Status: AC
  Administered 2016-12-26: 1 mg via INTRAMUSCULAR
  Filled 2016-12-26: qty 1

## 2016-12-26 NOTE — Clinical Social Work Note (Signed)
Clinical Social Work Assessment  Patient Details  Name: [name] [name] MRN: [id number] Date of Birth: 09-23-1915  Date of referral:  12/26/16               Reason for consult:  Discharge Planning                Permission sought to share information with:    Permission granted to share information::     Name::        Agency::     Relationship::     Contact Information:  Warden Fillers, neice  Housing/Transportation Living arrangements for the past 2 months:  [place] of Information:  Patient Patient Interpreter Needed:  None Criminal Activity/Legal Involvement Pertinent to Current Situation/Hospitalization:  No - Comment as needed Significant Relationships:  Other Family Members Lives with:  Self Do you feel safe going back to the place where you live?  No Need for family participation in patient care:  Yes (Comment)  Care giving concerns: None identified at baseline.   Social Worker assessment / plan:  Patient spoke with niece, Johnell Comings. At baseline, patient lives alone, uses a walker and and is independent in most ADLs. Patient's niece and sister cook and shop for patient. PT eval was discussed. It was agreed that patient needed SNF. Ms. Huey Bienenstock advised that they desired [facility] or [facility].   Employment status:    Insurance informationEducational psychologist PT Recommendations:  [place] / Referral to community resources:      Patient/Family's Response to care:  Family is agreeable to go to SNF.  Patient/Family's Understanding of and Emotional Response to Diagnosis, Current Treatment, and Prognosis: Family understands patient's diagnosis, treatment and prognosis.   Emotional Assessment Appearance:  Appears stated age Attitude/Demeanor/Rapport:  Unable to Assess Affect (typically observed):  Unable to Assess Orientation:  Oriented to Self, Oriented to Place Alcohol / Substance use:  Not  Applicable Psych involvement (Current and /or in the community):  No (Comment)  Discharge Needs  Concerns to be addressed:  Discharge Planning Concerns Readmission within the last 30 days:  No Current discharge risk:  None Barriers to Discharge:  No Barriers Identified   Ihor Gully, LCSW 12/26/2016, 3:19 PM

## 2016-12-26 NOTE — NC FL2 (Signed)
[place] LEVEL OF CARE SCREENING TOOL     IDENTIFICATION  Patient Name: [name] [name] Birthdate: 1915/12/14 Sex: male Admission Date (Current Location): 12/24/2016  [facility] and [place] Number:  [employer] and Address:  [place] [address] [place]      Provider Number: [id number]  Attending Physician Name and Address:  Rosita Fire, MD  Relative Name and Phone Number:       Current Level of Care:   Recommended Level of Care:   Prior Approval Number:    Date Approved/Denied:   PASRR Number: [id number] ([id number])  Discharge Plan: SNF    Current Diagnoses: Patient Active Problem List   Diagnosis Date Noted  . Stroke (cerebrum) ([place]) 12/24/2016  . Gait instability 02/05/2015  . Platelets decreased ([place]) 09/30/2014  . 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 Thrombocytopenia ([place]) 09/30/2014  . At high risk for falls 08/08/2014  . Weakness 05/21/2014  . Hyperlipidemia 03/19/2009  . Coronary atherosclerosis 03/19/2009  . ATRIAL FIBRILLATION 03/19/2009  . EDEMA 03/19/2009  . BENIGN PROSTATIC HYPERTROPHY, HX OF 03/19/2009    Orientation RESPIRATION BLADDER Height & Weight     Self  Normal Continent Weight: 162 lb 0.6 oz (73.5 kg) Height:  5\' 8"  (172.7 cm)  BEHAVIORAL SYMPTOMS/MOOD NEUROLOGICAL BOWEL NUTRITION STATUS      Continent Diet (Heart Healthy)  AMBULATORY STATUS COMMUNICATION OF NEEDS Skin   Limited Assist Verbally Normal                       Personal Care Assistance Level of Assistance  Bathing, Feeding, Dressing Bathing Assistance: Limited assistance Feeding assistance: Independent Dressing Assistance: Limited assistance     Functional Limitations Info  Sight, Hearing, Speech Sight Info: Adequate Hearing Info: Adequate Speech Info: Adequate    SPECIAL CARE FACTORS FREQUENCY  PT (By licensed PT)     PT Frequency: 5x/week              Contractures Contractures  Info: Present    Additional Factors Info  Code Status, Allergies Code Status Info: DNR Allergies Info: Penicillins           Current Medications (12/26/2016):  This is the current hospital active medication list Current Facility-Administered Medications  Medication Dose Route Frequency Provider Last Rate Last Dose  . 0.9 %  sodium chloride infusion   Intravenous Continuous Truett Mainland, DO 10 mL/hr at 12/24/16 2101 10 mL/hr at 12/24/16 2101  . acetaminophen (TYLENOL) tablet 650 mg  650 mg Oral Q4H PRN Truett Mainland, DO       Or  . acetaminophen (TYLENOL) solution 650 mg  650 mg Per Tube Q4H PRN Truett Mainland, DO       Or  . acetaminophen (TYLENOL) suppository 650 mg  650 mg Rectal Q4H PRN Truett Mainland, DO      . aspirin EC tablet 81 mg  81 mg Oral Daily Tanna Savoy Stinson, DO   81 mg at 12/26/16 0953  . busPIRone (BUSPAR) tablet 7.5 mg  7.5 mg Oral BID Tanna Savoy Stinson, DO   7.5 mg at 12/26/16 [id number]  . docusate sodium (COLACE) capsule 100 mg  100 mg Oral Daily PRN Tanna Savoy Stinson, DO      . enoxaparin (LOVENOX) injection 40 mg  40 mg Subcutaneous Q24H Tanna Savoy Stinson, DO   40 mg at 12/25/16 2059  . metoprolol (LOPRESSOR) tablet 50 mg  50  mg Oral BID Tanna Savoy Stinson, DO   50 mg at 12/26/16 [id number]  . omega-3 acid ethyl esters (LOVAZA) capsule 1 g  1 g Oral BID Tanna Savoy Stinson, DO   1 g at 12/26/16 [id number]  . pantoprazole (PROTONIX) EC tablet 40 mg  40 mg Oral Daily Tanna Savoy Stinson, DO   40 mg at 12/26/16 [id number]  . senna (SENOKOT) tablet 8.6 mg  1 tablet Oral Daily Tanna Savoy Stinson, DO   8.6 mg at 12/26/16 [id number]  . senna-docusate (Senokot-S) tablet 1 tablet  1 tablet Oral QHS PRN Tanna Savoy Stinson, DO      . simvastatin (ZOCOR) tablet 40 mg  40 mg Oral q1800 Tanna Savoy Stinson, DO   40 mg at 12/25/16 1717     Discharge Medications: Please see discharge summary for a list of discharge medications.  Relevant Imaging Results:  Relevant Lab Results:   Additional Information SSN [id number]  [address] Clydene Pugh, LCSW

## 2016-12-26 NOTE — Progress Notes (Signed)
Physical Therapy Note  Patient Details  Name: TAKUMI SOWERBY MRN: [id number] Date of Birth: 1915/11/14 Today's Date: 12/26/2016    Pt declined therapy today.   Teena Irani, PTA/CLT [phone]    12/26/2016, 4:56 PM

## 2016-12-26 NOTE — Progress Notes (Signed)
PROGRESS NOTE    [name] [name]  [id number] DOB: 1915/03/01 DOA: 12/24/2016 PCP: Redge Gainer, MD   Brief Narrative: 83 year old male with history of atrial fibrillation not on anticoagulation because of fall risk, hypertension, coronary artery disease, BPH, possible Alzheimer's dementia presented with right leg weakness. In the ER CT scan of head no acute finding. X-ray of right hip unremarkable. Admitted for further evaluation.  Assessment & Plan:   #Generalized weakness including lower extremities weakness: -I believe this is patient's physical deconditioning. No evidence of stroke. -MRI and MRA of brain with no acute finding. -Carotid ultrasound showed no hemodynamically significant stenosis. -Echocardiogram: LVEF and diastolic dysfunction. -TSH acceptable. -PT, OT and social worker evaluation for possible SNF on discharge. Discussed with the social worker today regarding discharge to SNF. Waiting for bed availability and further arrangement.  -LDL 52, A1c 5.7.  #Chronic atrial fibrillation: Heart rate controlled. Not on anticoagulation because of fall risk. On metoprolol.  #Gait instability: Likely due to generalized weakness and physical deconditioning. Supportive care.  #History of coronary artery disease: Continue aspirin, metoprolol and simvastatin.  #Mild thrombocytopenia: No sign of bleeding. Continue to monitor.  #Goals of care: Patient is DNR/DNI. Patient likely has Alzheimer's dementia without behavioral disturbance. Continue supportive care.  DVT prophylaxis: Lovenox subcutaneous Code Status: DO NOT RESUSCITATE Family Communication: No family present at bedside Disposition Plan: Likely discharge to nursing home in 1-2 days.  Consultants:   None  Procedures: MRI/MRA, carotid Doppler, echo  Antimicrobials: None  Subjective: Patient was seen and examined at bedside. No new event. Patient was sitting on chair comfortably. Denied headache, dizziness, nausea  vomiting or abdominal pain. Review of system is unreliable because of his dementia.   Objective: Vitals:   12/26/16 0150 12/26/16 0550 12/26/16 0950 12/26/16 1350  BP: 135/64 (!) 155/93 138/71 (!) 139/59  Pulse: 72 78 81 62  Resp: 18 18 18 16   Temp: 97.8 F (36.6 C) 97.6 F (36.4 C) 98.6 F (37 C) 98.3 F (36.8 C)  TempSrc: Oral Oral  Oral  SpO2: 94% 94% 98% 97%  Weight:      Height:        Intake/Output Summary (Last 24 hours) at 12/26/16 1526 Last data filed at 12/26/16 1300  Gross per 24 hour  Intake              360 ml  Output              300 ml  Net               60 ml   Filed Weights   12/24/16 2000 12/24/16 2017  Weight: 72.4 kg (159 lb 9.8 oz) 73.5 kg (162 lb 0.6 oz)    Examination:  General exam: Pleasant male sitting on chair comfortably, not in distress. Respiratory system: Clear to auscultation. Respiratory effort normal. No wheezing or crackle Cardiovascular system: S1 & S2 heard, regular.  No pedal edema. Gastrointestinal system: Abdomen is nondistended, soft and nontender. Normal bowel sounds heard. Central nervous system: Alert, awake and following simple commands. Oriented to himself only. Extremities: Symmetric 5 x 5 power. Skin: No rashes, lesions or ulcers Psychiatry: Judgement and insight appear impaired. Mood & affect flat     Data Reviewed: I have personally reviewed following labs and imaging studies  CBC:  Recent Labs Lab 12/24/16 1542  WBC 6.9  NEUTROABS 6.4  HGB 14.0  HCT 43.7  MCV 93.8  PLT [id number]*   Basic Metabolic Panel:  Recent  Labs Lab 12/24/16 1542  NA 139  K 4.1  CL 101  CO2 29  GLUCOSE 113*  BUN 26*  CREATININE 1.10  CALCIUM 9.4   GFR: Estimated Creatinine Clearance: 35.4 mL/min (by C-G formula based on SCr of 1.1 mg/dL). Liver Function Tests:  Recent Labs Lab 12/24/16 1542  AST 25  ALT 16*  ALKPHOS 53  BILITOT 0.7  PROT 7.7  ALBUMIN 4.3   No results for input(s): LIPASE, AMYLASE in the last 168  hours. No results for input(s): AMMONIA in the last 168 hours. Coagulation Profile: No results for input(s): INR, PROTIME in the last 168 hours. Cardiac Enzymes: No results for input(s): CKTOTAL, CKMB, CKMBINDEX, TROPONINI in the last 168 hours. BNP (last 3 results) No results for input(s): PROBNP in the last 8760 hours. HbA1C:  Recent Labs  12/25/16 0552  HGBA1C 5.7*   CBG: No results for input(s): GLUCAP in the last 168 hours. Lipid Profile:  Recent Labs  12/25/16 0552  CHOL 104  HDL 42  LDLCALC 52  TRIG 52  CHOLHDL 2.5   Thyroid Function Tests:  Recent Labs  12/24/16 1542  TSH 2.490   Anemia Panel: No results for input(s): VITAMINB12, FOLATE, FERRITIN, TIBC, IRON, RETICCTPCT in the last 72 hours. Sepsis Labs: No results for input(s): PROCALCITON, LATICACIDVEN in the last 168 hours.  No results found for this or any previous visit (from the past 240 hour(s)).       Radiology Studies: Dg Chest 1 View  Result Date: 12/24/2016 CLINICAL DATA:  83 year old male with history of weakness. EXAM: CHEST 1 VIEW COMPARISON:  Chest x-ray 09/14/2015. FINDINGS: Low lung volumes. Mild diffuse peribronchial cuffing. No acute consolidative airspace disease. No pleural effusions. No evidence of pulmonary edema. Heart size is mildly enlarged. The patient is rotated to the right on today's exam, resulting in distortion of the mediastinal contours and reduced diagnostic sensitivity and specificity for mediastinal pathology. Atherosclerosis in the thoracic aorta. Status post median sternotomy for CABG, including LIMA to the LAD. IMPRESSION: 1. Diffuse peribronchial cuffing, concerning for an acute bronchitis. 2. Cardiomegaly. 3. Aortic atherosclerosis. Electronically Signed   By: Vinnie Langton M.D.   On: 12/24/2016 16:20   Ct Head Wo Contrast  Result Date: 12/24/2016 CLINICAL DATA:  Weakness. EXAM: CT HEAD WITHOUT CONTRAST TECHNIQUE: Contiguous axial images were obtained from the  base of the skull through the vertex without intravenous contrast. COMPARISON:  CT of the orbits November 21, 2014 FINDINGS: Brain: No subdural, epidural, or subarachnoid hemorrhage. Ventricles and sulci are prominent, likely due to age related atrophy. Cerebellum, brainstem, and basal cisterns are normal. Scattered white matter changes. No acute cortical ischemia or infarct identified. No mass, mass effect, or midline shift. Vascular: Dense calcification in the intracranial carotid arteries. Skull: Normal. Negative for fracture or focal lesion. Sinuses/Orbits: There is continued opacification left maxillary sinus with mixed attenuation material including high attenuation. There is expansion of the maxillary sinus medially. These findings are unchanged since 2015. There is now opacification of left-sided ethmoid air cells and the left frontal sinus, new in the interval. The remainder of the paranasal sinuses are well aerated. The middle ears and mastoid air cells are normal. Other: None. IMPRESSION: 1. No acute intracranial abnormality. 2. 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 New opacification of left ethmoid air cells and the frontal sinus. Electronically Signed   By: Dorise Bullion III M.D   On: 12/24/2016 16:21   Mr Brain Wo Contrast  Result  Date: 12/25/2016 CLINICAL DATA:  Weakness.  Concern for stroke. EXAM: MRI HEAD WITHOUT CONTRAST MRA HEAD WITHOUT CONTRAST TECHNIQUE: Multiplanar, multiecho pulse sequences of the brain and surrounding structures were obtained without intravenous contrast. Angiographic images of the head were obtained using MRA technique without contrast. COMPARISON:  Head CT from yesterday. FINDINGS: MRI HEAD FINDINGS Brain: No acute infarct, hemorrhage, hydrocephalus, or mass. There is advanced atrophy. Prominent FLAIR hyperintensity around the temporal horns the lateral ventricles is likely gliosis. No swelling to suggest that this is edema or inflammation.  Rather, the temporal horns of the lateral ventricles are markedly dilated from advanced mesial temporal volume loss, finding which can be seen with Alzheimer's disease in the appropriate clinical setting. Small-vessel ischemic gliosis in the cerebral white matter is overall mild for age. 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 No chronic blood products. Vascular: Preserved flow voids.  Arterial findings below Skull and upper cervical spine: Facet arthropathy in the visible cervical spine. No marrow lesion noted. Sinuses/Orbits: There is near complete opacification of left maxillary, anterior ethmoid, and frontal sinuses due to middle meatus obstruction. Left maxillary sinus opacification is from mucosal thickening and hypointense inspissated secretions or fungal elements which medially bow the medial wall. No evidence of orbital intracranial extension. No acute orbital finding. Left cataract resection. Other: Subcentimeter probable cyst in the left parotid region, considered incidental MRA HEAD FINDINGS Dominant left vertebral artery. Fetal type PCA on the right. Anterior communicating artery. No visible left posterior communicating artery. No major branch occlusion or flow limiting stenosis. Apparent narrowing is at the bilateral M2 branches is likely from artifact. Negative for aneurysm. IMPRESSION: 1. No acute finding. 2. Atrophy, including prominent mesial temporal volume loss. If there is dementia, Alzheimer's disease could have this pattern. 3. Mild for age chronic microvascular ischemia. 4. Chronic sinusitis from left middle meatus obstruction, as described. Electronically Signed   By: Monte Fantasia M.D.   On: 12/25/2016 12:00   US Carotid Bilateral (at [facility] Only)  Result Date: 12/25/2016 CLINICAL DATA:  Acute stroke symptoms, hyperlipidemia, tobacco use EXAM: BILATERAL CAROTID DUPLEX ULTRASOUND TECHNIQUE: Pearline Cables scale imaging, color Doppler and duplex ultrasound were performed of bilateral  carotid and vertebral arteries in the neck. COMPARISON:  12/24/2016 CT FINDINGS: Criteria: Quantification of carotid stenosis is based on velocity parameters that correlate the residual internal carotid diameter with NASCET-based stenosis levels, using the diameter of the distal internal carotid lumen as the denominator for stenosis measurement. The following velocity measurements were obtained: RIGHT ICA:  113/9 cm/sec CCA:  [id number] cm/sec SYSTOLIC ICA/CCA RATIO:  1.9 DIASTOLIC ICA/CCA RATIO:  1.1 ECA:  115 cm/sec LEFT ICA:  107/10 cm/sec CCA:  [id number] cm/sec SYSTOLIC ICA/CCA RATIO:  1.8 DIASTOLIC ICA/CCA RATIO:  2.2 ECA:  113 cm/sec RIGHT CAROTID ARTERY: Moderate heterogeneous and partially calcified right carotid bifurcation atherosclerosis extends into the proximal ICA. Ulcerated plaque formation suspected of the right carotid bifurcation. Despite this, no hemodynamically significant right ICA stenosis, velocity elevation, or turbulent flow. Degree of narrowing less than 50%. RIGHT VERTEBRAL ARTERY:  Antegrade LEFT CAROTID ARTERY: Similar moderate heterogeneous and partially calcified left carotid bifurcation atherosclerosis. Despite this, no hemodynamically significant left ICA stenosis, velocity elevation, or turbulent flow. LEFT VERTEBRAL ARTERY:  Antegrade IMPRESSION: Moderate bilateral carotid atherosclerosis as above. No hemodynamically significant ICA stenosis. Degree of narrowing less than 50% bilaterally. Patent antegrade vertebral flow bilaterally Electronically Signed   By: Jerilynn Mages.  Shick M.D.   On: 12/25/2016 11:00   Mr Jodene Nam Head/brain  Wo Cm  Result Date: 12/25/2016 CLINICAL DATA:  Weakness.  Concern for stroke. EXAM: MRI HEAD WITHOUT CONTRAST MRA HEAD WITHOUT CONTRAST TECHNIQUE: Multiplanar, multiecho pulse sequences of the brain and surrounding structures were obtained without intravenous contrast. Angiographic images of the head were obtained using MRA technique without contrast. COMPARISON:  Head CT from  yesterday. FINDINGS: MRI HEAD FINDINGS Brain: No acute infarct, hemorrhage, hydrocephalus, or mass. There is advanced atrophy. Prominent FLAIR hyperintensity around the temporal horns the lateral ventricles is likely gliosis. No swelling to suggest that this is edema or inflammation. Rather, the temporal horns of the lateral ventricles are markedly dilated from advanced mesial temporal volume loss, finding which can be seen with Alzheimer's disease in the appropriate clinical setting. Small-vessel ischemic gliosis in the cerebral white matter is overall mild for age. 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 No chronic blood products. Vascular: Preserved flow voids.  Arterial findings below Skull and upper cervical spine: Facet arthropathy in the visible cervical spine. No marrow lesion noted. Sinuses/Orbits: There is near complete opacification of left maxillary, anterior ethmoid, and frontal sinuses due to middle meatus obstruction. Left maxillary sinus opacification is from mucosal thickening and hypointense inspissated secretions or fungal elements which medially bow the medial wall. No evidence of orbital intracranial extension. No acute orbital finding. Left cataract resection. Other: Subcentimeter probable cyst in the left parotid region, considered incidental MRA HEAD FINDINGS Dominant left vertebral artery. Fetal type PCA on the right. Anterior communicating artery. No visible left posterior communicating artery. No major branch occlusion or flow limiting stenosis. Apparent narrowing is at the bilateral M2 branches is likely from artifact. Negative for aneurysm. IMPRESSION: 1. No acute finding. 2. Atrophy, including prominent mesial temporal volume loss. If there is dementia, Alzheimer's disease could have this pattern. 3. Mild for age chronic microvascular ischemia. 4. Chronic sinusitis from left middle meatus obstruction, as described. Electronically Signed   By: Monte Fantasia M.D.   On: 12/25/2016  12:00   Dg Hip Unilat W Or Wo Pelvis 2-3 Views Right  Result Date: 12/24/2016 CLINICAL DATA:  Weakness today. EXAM: DG HIP (WITH OR WITHOUT PELVIS) 2-3V RIGHT COMPARISON:  None. FINDINGS: No acute abnormality is identified. Mild degenerative change is present about the hips. No focal bony lesion. There is partial visualization of lower lumbar degenerative change. IMPRESSION: Negative exam. Electronically Signed   By: Inge Rise M.D.   On: 12/24/2016 16:20        Scheduled Meds: . aspirin EC  81 mg Oral Daily  . busPIRone  7.5 mg Oral BID  . enoxaparin (LOVENOX) injection  40 mg Subcutaneous Q24H  . metoprolol  50 mg Oral BID  . omega-3 acid ethyl esters  1 g Oral BID  . pantoprazole  40 mg Oral Daily  . senna  1 tablet Oral Daily  . simvastatin  40 mg Oral q1800   Continuous Infusions: . sodium chloride 10 mL/hr (12/24/16 2101)     LOS: 2 days    Dron Tanna Furry, MD [facility] Pager [phone]  If 7PM-7AM, please contact night-coverage [email or web address] Password [email or web address] 12/26/2016, 3:26 PM

## 2016-12-26 NOTE — Progress Notes (Signed)
*  PRELIMINARY RESULTS* Echocardiogram 2D Echocardiogram has been performed.  Leavy Cella 12/26/2016, 12:18 PM

## 2016-12-26 NOTE — Evaluation (Addendum)
Occupational Therapy Evaluation Patient Details Name: [name] [name] MRN: [id number] DOB: [birth year] Today's Date: 12/26/2016    History of Present Illness [name] [name] is a 83 y.o. male with a history of rate controlled atrial fibrillation not on anticoagulation due to fall risk, hypertension, coronary artery disease, BPH, possible senile dementia. Patient was reported to be normal yesterday. The patient tried to walk today and had difficulty maintaining weight on right leg. No palliating or provoking factors. Denies fevers, chills, nausea, vomiting. Appetite is good. Patient tried to get up to go to the bathroom, but got weak in the knees and legs. With the ER physician, the patient was unable to bear weight on his right leg. No pain, only weakness   Clinical Impression   Pt in bed upon therapy arrival and agreeable to participate in OT session. Patient presents with generalized weakness during evaluation and would benefit from skilled OT services a SNF to increase functional performance during daily tasks at home.    Follow Up Recommendations  SNF    Equipment Recommendations  Other (comment) (TBD)       Precautions / Restrictions Precautions Precautions: Fall Restrictions Weight Bearing Restrictions: No      Mobility Bed Mobility Overal bed mobility: Modified Independent                Transfers Overall transfer level: Needs assistance Equipment used: None Transfers: Sit to/from Stand Sit to Stand: Min assist;From elevated surface                   ADL Overall ADL's : Needs assistance/impaired Eating/Feeding: Set up;Sitting                   Lower Body Dressing: Total assistance;Bed level   Toilet Transfer: Minimal assistance;Stand-pivot           Functional mobility during ADLs: Minimal assistance;Cueing for safety                 Pertinent Vitals/Pain Pain Assessment: No/denies pain     Hand Dominance Right    Extremity/Trunk Assessment Upper Extremity Assessment Upper Extremity Assessment: Generalized weakness   Lower Extremity Assessment Lower Extremity Assessment: Defer to PT evaluation       Communication Communication Communication: No difficulties   Cognition Arousal/Alertness: Awake/alert Behavior During Therapy: WFL for tasks assessed/performed Overall Cognitive Status: History of cognitive impairments - at baseline                                Home Living Family/patient expects to be discharged to:: Skilled nursing facility                                        Prior Functioning/Environment Level of Independence: Needs assistance  Gait / Transfers Assistance Needed: uses roller walker for transfers and gait ADL's / Homemaking Assistance Needed: Per chart patient receives assistance from aide and family.            OT Problem List: Decreased strength                       End of Session Equipment Utilized During Treatment: Gait belt  Activity Tolerance: Patient tolerated treatment well Patient left: in chair;with call bell/phone within reach;with chair alarm set   OT G-codes **NOT FOR  INPATIENT CLASS**  Functional Limitation Self care  Self Care Current Status [phone]) CM  Self Care Goal Status [id number]) CM  Self Care Discharge Status [phone]) CM   TimeOT:[id number] OT Time Calculation (min): 17 min Charges:  OT General Charges $OT Visit: 1 Procedure OT Evaluation $OT Eval Low Complexity: 1 Procedure G-Codes:    2017-01-08, 10:27 AM Ailene Ravel, OTR/L,CBIS  [phone]

## 2016-12-26 NOTE — Evaluation (Addendum)
Speech Language Pathology Evaluation Patient Details Name: [name] [name] MRN: [id number] DOB: August 21, 1915 Today's Date: 12/26/2016 Time: [id number] SLP Time Calculation (min) (ACUTE ONLY): 25 min  Problem List:  Patient Active Problem List   Diagnosis Date Noted  . Stroke (cerebrum) ([place]) 12/24/2016  . Gait instability 02/05/2015  . Platelets decreased ([place]) 09/30/2014  . 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 Thrombocytopenia ([place]) 09/30/2014  . At high risk for falls 08/08/2014  . Weakness 05/21/2014  . Hyperlipidemia 03/19/2009  . Coronary atherosclerosis 03/19/2009  . ATRIAL FIBRILLATION 03/19/2009  . EDEMA 03/19/2009  . BENIGN PROSTATIC HYPERTROPHY, HX OF 03/19/2009   Past Medical History:  Past Medical History:  Diagnosis Date  . Atrial fibrillation ([place])   . BPH (benign prostatic hypertrophy)    hx  . CAD (coronary artery disease)   . Cataract   . Edema   . HLD (hyperlipidemia)    miixed  . Long term (current) use of anticoagulants    coumadin theraoy   Past Surgical History:  Past Surgical History:  Procedure Laterality Date  . bilateral inguinal hernia repairs    . CARDIAC SURGERY    . CHOLECYSTECTOMY    . CORONARY ARTERY BYPASS GRAFT  1998   LIMA to LAD, SVG to diagonal, SVG to OM1, SVG to OM2, OM to PDA. had a cath in 2001 demonstratig grafts to be patent  . HERNIA REPAIR     HPI:  [name] [name] is a 83 y.o. male with a history of rate controlled atrial fibrillation not on anticoagulation due to fall risk, hypertension, coronary artery disease, BPH, possible senile dementia. Patient was reported to be normal on Friday. The patient tried to walk on Saturday and had difficulty maintaining weight on right leg. No palliating or provoking factors. Denies fevers, chills, nausea, vomiting. Appetite is good. Patient tried to get up to go to the bathroom, but got weak in the knees and legs. With the ER physician, the patient was unable to bear weight on his right leg. No  pain, only weakness. MRA HEAD FINDINGS: Dominant left vertebral artery. Fetal type PCA on the right. Anterior communicating artery. No visible left posterior communicating artery. No major branch occlusion or flow limiting stenosis. Apparent narrowing is at the bilateral M2 branches is likely from artifact. Negative for aneurysm. IMPRESSION: 1. No acute finding 2. Atrophy, including prominent mesial temporal volume loss. Ifthere is dementia, Alzheimer's disease could have this pattern.3. Mild for age chronic microvascular ischemia. 4. Chronic sinusitis from left middle meatus obstruction, as described. SLE ordered to assess cognition       Assessment / Plan / Recommendation Clinical Impression            [name] [name] was pleasant and cooperative throughout his evaluation. When asked about recent changes with memory and cognition he replied "maybe my memory isn't as good." He presents with moderate cognitive-linguistic deficits, that are different from his baseline. Specifically, he has  particular difficulty with memory, category naming, and attention to task/conversation, as he frequently switched topics when asked to complete cognitive tasks.  He answered basic objective yes/no questions with 100% accuracy and he expressed wants and needs with no difficulty.  He was unable to recall short term information, he was oriented to time of day and self only, and he frequently spoke of his wife who "still works at the [employer] He demonstrated decreased understanding of call light use after SLP provided  initial instruction and decreased understanding of emergency scenarios (calling  911). Basic reading comprehension was [facility], as he read short phrases and sentences from the newspaper. Verbal expression is WFL, intelligibility is 90% in conversation. Although he has dx of possible Alzheimer's related dementia, ST in SNF is recommended d/t poor safety awareness and recall. Spoke with pt about recommendation and  he is in agreement. Plan to speak with case management and nursing about recommendation. No further ST is recommended in the acute setting.   Diagnosis: Cognitive Communication Deficit R41.841   SLP Assessment  All further Speech Lanaguage Pathology  needs can be addressed in the next venue of care    Follow Up Recommendations  Skilled Nursing facility    Frequency and Duration           SLP Evaluation Cognition  Overall Cognitive Status: Impaired/Different from baseline (pt does have hx of cognitive impairment ) Arousal/Alertness: Awake/alert Orientation Level: Oriented to time;Disoriented to person Attention: Alternating;Sustained Sustained Attention: Impaired Sustained Attention Impairment: Verbal basic Alternating Attention: Impaired Alternating Attention Impairment: Verbal basic Memory: Impaired Memory Impairment: Decreased recall of new information;Decreased short term memory Decreased Short Term Memory: Verbal basic Awareness: Impaired Problem Solving: Impaired Problem Solving Impairment: Verbal basic Executive Function: Reasoning;Sequencing;Organizing Reasoning: Impaired Reasoning Impairment: Verbal basic Sequencing: Impaired Sequencing Impairment: Verbal basic Organizing: Impaired Organizing Impairment: Verbal basic Behaviors: Other (comment) (pleasant and cooperative ) Safety/Judgment: Impaired       Comprehension  Auditory Comprehension Overall Auditory Comprehension: Appears within functional limits for tasks assessed Yes/No Questions: Within Functional Limits Commands: Within Functional Limits Conversation: Simple Visual Recognition/Discrimination Discrimination: Within Function Limits (wears glasses (unable to locate glasses in his room) ) Reading Comprehension Reading Status: Within funtional limits    Expression Expression Primary Mode of Expression: Verbal Verbal Expression Overall Verbal Expression: Appears within functional limits for tasks  assessed Initiation: No impairment Repetition: No impairment Naming: No impairment Pragmatics: No impairment Written Expression Written Expression: Not tested   Oral / Motor  Oral Motor/Sensory Function Overall Oral Motor/Sensory Function: Within functional limits Motor Speech Overall Motor Speech: Appears within functional limits for tasks assessed Respiration: Within functional limits Resonance: Within functional limits Articulation: Within functional limitis Intelligibility: Intelligible Motor Planning: Witnin functional limits   G Codes: Clinical judgment Memory CJ CJ CJ  Thank you,   Renato Gails. Megan Salon [place], CCC-SLP  Speech-Language Pathologist [phone]                        Luther Redo 12/26/2016, 2:23 PM

## 2016-12-27 DIAGNOSIS — I10 Essential (primary) hypertension: Secondary | ICD-10-CM | POA: Diagnosis not present

## 2016-12-27 DIAGNOSIS — D649 Anemia, unspecified: Secondary | ICD-10-CM | POA: Diagnosis not present

## 2016-12-27 DIAGNOSIS — R0602 Shortness of breath: Secondary | ICD-10-CM | POA: Diagnosis not present

## 2016-12-27 DIAGNOSIS — I517 Cardiomegaly: Secondary | ICD-10-CM | POA: Diagnosis not present

## 2016-12-27 DIAGNOSIS — J069 Acute upper respiratory infection, unspecified: Secondary | ICD-10-CM | POA: Diagnosis not present

## 2016-12-27 DIAGNOSIS — R05 Cough: Secondary | ICD-10-CM | POA: Diagnosis not present

## 2016-12-27 DIAGNOSIS — I639 Cerebral infarction, unspecified: Secondary | ICD-10-CM | POA: Diagnosis not present

## 2016-12-27 DIAGNOSIS — Z743 Need for continuous supervision: Secondary | ICD-10-CM | POA: Diagnosis not present

## 2016-12-27 DIAGNOSIS — G309 Alzheimer's disease, unspecified: Secondary | ICD-10-CM | POA: Diagnosis not present

## 2016-12-27 DIAGNOSIS — R262 Difficulty in walking, not elsewhere classified: Secondary | ICD-10-CM | POA: Diagnosis not present

## 2016-12-27 DIAGNOSIS — R531 Weakness: Secondary | ICD-10-CM | POA: Diagnosis not present

## 2016-12-27 DIAGNOSIS — I5030 Unspecified diastolic (congestive) heart failure: Secondary | ICD-10-CM | POA: Diagnosis not present

## 2016-12-27 DIAGNOSIS — I482 Chronic atrial fibrillation: Secondary | ICD-10-CM | POA: Diagnosis not present

## 2016-12-27 DIAGNOSIS — J918 Pleural effusion in other conditions classified elsewhere: Secondary | ICD-10-CM | POA: Diagnosis not present

## 2016-12-27 DIAGNOSIS — K5900 Constipation, unspecified: Secondary | ICD-10-CM | POA: Diagnosis not present

## 2016-12-27 DIAGNOSIS — R279 Unspecified lack of coordination: Secondary | ICD-10-CM | POA: Diagnosis not present

## 2016-12-27 DIAGNOSIS — I4891 Unspecified atrial fibrillation: Secondary | ICD-10-CM | POA: Diagnosis not present

## 2016-12-27 DIAGNOSIS — R0981 Nasal congestion: Secondary | ICD-10-CM | POA: Diagnosis not present

## 2016-12-27 DIAGNOSIS — D696 Thrombocytopenia, unspecified: Secondary | ICD-10-CM | POA: Diagnosis not present

## 2016-12-27 DIAGNOSIS — I771 Stricture of artery: Secondary | ICD-10-CM | POA: Diagnosis not present

## 2016-12-27 DIAGNOSIS — R2681 Unsteadiness on feet: Secondary | ICD-10-CM | POA: Diagnosis not present

## 2016-12-27 DIAGNOSIS — R2689 Other abnormalities of gait and mobility: Secondary | ICD-10-CM | POA: Diagnosis not present

## 2016-12-27 DIAGNOSIS — K219 Gastro-esophageal reflux disease without esophagitis: Secondary | ICD-10-CM | POA: Diagnosis not present

## 2016-12-27 DIAGNOSIS — N401 Enlarged prostate with lower urinary tract symptoms: Secondary | ICD-10-CM | POA: Diagnosis not present

## 2016-12-27 DIAGNOSIS — R29898 Other symptoms and signs involving the musculoskeletal system: Secondary | ICD-10-CM | POA: Diagnosis not present

## 2016-12-27 DIAGNOSIS — I2510 Atherosclerotic heart disease of native coronary artery without angina pectoris: Secondary | ICD-10-CM | POA: Diagnosis not present

## 2016-12-27 DIAGNOSIS — R609 Edema, unspecified: Secondary | ICD-10-CM | POA: Diagnosis not present

## 2016-12-27 DIAGNOSIS — E785 Hyperlipidemia, unspecified: Secondary | ICD-10-CM | POA: Diagnosis not present

## 2016-12-27 DIAGNOSIS — E559 Vitamin D deficiency, unspecified: Secondary | ICD-10-CM | POA: Diagnosis not present

## 2016-12-27 DIAGNOSIS — Z79899 Other long term (current) drug therapy: Secondary | ICD-10-CM | POA: Diagnosis not present

## 2016-12-27 NOTE — Discharge Summary (Addendum)
Physician Discharge Summary  [name] [name] [id number] DOB: 18-Aug-1915 DOA: 12/24/2016  PCP: Redge Gainer, MD  Admit date: 12/24/2016 Discharge date: 12/27/2016  Admitted From:home Disposition:SNF  Recommendations for Outpatient Follow-up:  1. Follow up with PCP in 1-2 weeks 2. Please obtain BMP/CBC in one week 3. Please monitor blood pressure   Home Health: SNF Equipment/Devices: No Discharge Condition: Stable CODE STATUS: DO NOT RESUSCITATE Diet recommendation: Heart healthy  Brief/Interim Summary:83 year old male with history of atrial fibrillation not on anticoagulation because of fall risk, hypertension, coronary artery disease, BPH, possible Alzheimer's dementia presented with right leg weakness. In the ER CT scan of head no acute finding. X-ray of right hip unremarkable. Admitted for further evaluation.  #Generalized weakness including lower extremities weakness: -I believe this is patient's physical deconditioning. No evidence of stroke, it is ruled out.  -MRI and MRA of brain with no acute finding. -Carotid ultrasound showed no hemodynamically significant stenosis. -Echocardiogram: LVEF and diastolic dysfunction. -TSH acceptable. -PT, OT and social worker evaluation for possible SNF on discharge.  patient is being discharged to SNF for further care and rehabilitation today.  -LDL 52, A1c 5.7.  #Chronic atrial fibrillation: Heart rate controlled. Not on anticoagulation because of fall risk. On metoprolol.  #Gait instability: Likely due to generalized weakness and physical deconditioning. Supportive care.  #History of coronary artery disease: Continue aspirin, metoprolol and simvastatin.  #Mild thrombocytopenia: No sign of bleeding. Continue to monitor.  #Goals of care: Patient is DNR/DNI. Patient likely has Alzheimer's dementia without behavioral disturbance. Continue supportive care.  #Hypertension: Patient is on multiple medications. I will continue  metoprolol. Holding lisinopril and Imdur because of borderline acceptable blood pressure in the hospital. Avoid hypotension in this elderly patient. Need to monitor blood pressure closely in the nursing home. Follow up with PCP.  Patient is medically stable to transfer his care to SNF.  Discharge Diagnoses:  Active Problems:   Coronary atherosclerosis   ATRIAL FIBRILLATION   Gait instability      Discharge Instructions  Discharge Instructions    Call MD for:  difficulty breathing, headache or visual disturbances    Complete by:  As directed    Call MD for:  hives    Complete by:  As directed    Call MD for:  persistant dizziness or light-headedness    Complete by:  As directed    Call MD for:  persistant nausea and vomiting    Complete by:  As directed    Call MD for:  temperature >100.4    Complete by:  As directed    Diet - low sodium heart healthy    Complete by:  As directed    Discharge instructions    Complete by:  As directed    Patient is on multiple cardiac medication. Please monitor blood pressure and adjust BP medication. May need to hold some of the medication if blood pressure is low.   Increase activity slowly    Complete by:  As directed      Allergies as of 12/27/2016      Reactions   Penicillins Other (See Comments)   Reaction is unknown      Medication List    STOP taking these medications   isosorbide dinitrate 10 MG tablet Commonly known as:  ISORDIL   lisinopril 10 MG tablet Commonly known as:  PRINIVIL,ZESTRIL     TAKE these medications   ASPIRIN EC LO-DOSE 81 MG EC tablet Generic drug:  aspirin TAKE 1 TABLET DAILY  busPIRone 15 MG tablet Commonly known as:  BUSPAR Take 1/2 tablet (7.5 mg total) by mouth 2 (two) times daily.   calcium-vitamin D 500-200 MG-UNIT tablet Commonly known as:  OSCAL WITH D Take 1 tablet by mouth every morning.   docusate sodium 100 MG capsule Commonly known as:  COLACE Take 100 mg by mouth as needed.    Fish Oil 1000 MG Caps Take 1 capsule by mouth 2 (two) times daily. 1000 mg?   furosemide 40 MG tablet Commonly known as:  LASIX TAKE 1 TABLET ONCE DAILY AS NEEDED FOR SEVERE SWELLING What changed:  See the new instructions.   metoprolol 50 MG tablet Commonly known as:  LOPRESSOR TAKE (1) TABLET TWICE A DAY.   omeprazole 20 MG capsule Commonly known as:  PRILOSEC TAKE (1) CAPSULE DAILY   simvastatin 40 MG tablet Commonly known as:  ZOCOR TAKE 1 TABLET IN THE EVENING FOR CHOLESTEROL   Vitamin D 2000 units Caps Take 1 capsule by mouth every morning.       Contact information for follow-up providers    Redge Gainer, MD. Schedule an appointment as soon as possible for a visit in 1 week(s).   Specialty:  Family Medicine Contact information: [place] [address] [phone]            Contact information for after-discharge care    Destination    [facility] Follow up.   Specialty:  [place] information: [place] [phone]                 Allergies  Allergen Reactions  . Penicillins Other (See Comments)    Reaction is unknown    Consultations: None  Procedures/Studies: MRI/MRA, echo.  Subjective: Patient was seen and examined at bedside. He was sitting on chair. Denied headache, dizziness, chest pain, shortness of breath. Review of systems Limited because of his dementia.   Discharge Exam: Vitals:   12/27/16 0405 12/27/16 0900  BP: (!) 144/54 137/69  Pulse: 68 75  Resp: 16   Temp:  98.5 F (36.9 C)   Vitals:   12/27/16 0000 12/27/16 0405 12/27/16 0900 12/27/16 1052  BP: (!) 103/55 (!) 144/54 137/69   Pulse: 80 68 75   Resp: 15 16    Temp: 98.1 F (36.7 C)  98.5 F (36.9 C)   TempSrc: Oral  Oral   SpO2: 97% 96% 96% 97%  Weight:      Height:        General: Pt is alert, awake, not in acute distress Cardiovascular: RRR, S1/S2 +, no rubs,  no gallops Respiratory: CTA bilaterally, no wheezing, no rhonchi Abdominal: Soft, NT, ND, bowel sounds + Extremities: no edema, no cyanosis  Neurology: Alert awake and following commands.  The results of significant diagnostics from this hospitalization (including imaging, microbiology, ancillary and laboratory) are listed below for reference.     Microbiology: No results found for this or any previous visit (from the past 240 hour(s)).   Labs: BNP (last 3 results) No results for input(s): BNP in the last 8760 hours. Basic Metabolic Panel:  Recent Labs Lab 12/24/16 1542  NA 139  K 4.1  CL 101  CO2 29  GLUCOSE 113*  BUN 26*  CREATININE 1.10  CALCIUM 9.4   Liver Function Tests:  Recent Labs Lab 12/24/16 1542  AST 25  ALT 16*  ALKPHOS 53  BILITOT 0.7  PROT 7.7  ALBUMIN  4.3   No results for input(s): LIPASE, AMYLASE in the last 168 hours. No results for input(s): AMMONIA in the last 168 hours. CBC:  Recent Labs Lab 12/24/16 1542  WBC 6.9  NEUTROABS 6.4  HGB 14.0  HCT 43.7  MCV 93.8  PLT 139*   Cardiac Enzymes: No results for input(s): CKTOTAL, CKMB, CKMBINDEX, TROPONINI in the last 168 hours. BNP: Invalid input(s): POCBNP CBG: No results for input(s): GLUCAP in the last 168 hours. D-Dimer No results for input(s): DDIMER in the last 72 hours. Hgb A1c  Recent Labs  12/25/16 0552  HGBA1C 5.7*   Lipid Profile  Recent Labs  12/25/16 0552  CHOL 104  HDL 42  LDLCALC 52  TRIG 52  CHOLHDL 2.5   Thyroid function studies  Recent Labs  12/24/16 1542  TSH 2.490   Anemia work up No results for input(s): VITAMINB12, FOLATE, FERRITIN, TIBC, IRON, RETICCTPCT in the last 72 hours. Urinalysis    Component Value Date/Time   COLORURINE YELLOW 12/24/2016 1430   APPEARANCEUR CLEAR 12/24/2016 1430   LABSPEC 1.010 12/24/2016 1430   PHURINE 5.0 12/24/2016 1430   GLUCOSEU NEGATIVE 12/24/2016 1430   HGBUR NEGATIVE 12/24/2016 1430   BILIRUBINUR  NEGATIVE 12/24/2016 1430   BILIRUBINUR neg 03/13/2013 1411   KETONESUR NEGATIVE 12/24/2016 1430   PROTEINUR NEGATIVE 12/24/2016 1430   UROBILINOGEN negative 03/13/2013 1411   NITRITE POSITIVE (A) 12/24/2016 1430   LEUKOCYTESUR SMALL (A) 12/24/2016 1430   Sepsis Labs Invalid input(s): PROCALCITONIN,  WBC,  LACTICIDVEN Microbiology No results found for this or any previous visit (from the past 240 hour(s)).   Time coordinating discharge: 26 minutes  SIGNED:   Rosita Fire, MD  [facility] 12/27/2016, 11:30 AM  If 7PM-7AM, please contact night-coverage [email or web address] Password [email or web address]

## 2016-12-27 NOTE — Clinical Social Work Placement (Signed)
   CLINICAL SOCIAL WORK PLACEMENT  NOTE  Date:  12/27/2016  Patient Details  Name: [name] [name] MRN: [id number] Date of Birth: Sep 23, 1915  Clinical Social Work is seeking post-discharge placement for this patient at the [place] level of care (*CSW will initial, date and re-position this form in  chart as items are completed):  Yes   Patient/family provided with [facility] Work Department's list of facilities offering this level of care within the geographic area requested by the patient (or if unable, by the patient's family).  Yes   Patient/family informed of their freedom to choose among providers that offer the needed level of care, that participate in Medicare, Medicaid or managed care program needed by the patient, have an available bed and are willing to accept the patient.  Yes   Patient/family informed of [facility]'s ownership interest in [facility] and [facility], as well as of the fact that they are under no obligation to receive care at these facilities.  PASRR submitted to EDS on 12/26/16     PASRR number received on 12/26/16     Existing PASRR number confirmed on       FL2 transmitted to all facilities in geographic area requested by pt/family on 12/26/16     FL2 transmitted to all facilities within larger geographic area on       Patient informed that his/her managed care company has contracts with or will negotiate with certain facilities, including the following:        Yes   Patient/family informed of bed offers received.  Patient chooses bed at [facility]     Physician recommends and patient chooses bed at      Patient to be transferred to [facility] on 12/27/16.  Patient to be transferred to facility by [facility]     Patient family notified on 12/27/16 of transfer.  Name of family member notified:  Nurse, adult     PHYSICIAN       Additional Comment:  CSW signing off.    _______________________________________________ Ihor Gully, LCSW 12/27/2016, 12:37 PM

## 2016-12-27 NOTE — Progress Notes (Addendum)
Discharged to [facility],report called,and given to [employer] RN, Vital signs stable. Transported via EMS to awaiting Facility.

## 2016-12-30 ENCOUNTER — Telehealth: Payer: Self-pay | Admitting: Family Medicine

## 2016-12-30 NOTE — Telephone Encounter (Signed)
FYI

## 2016-12-30 NOTE — Telephone Encounter (Signed)
I am aware and that is where he needs to be. It is a good placement location for him.

## 2017-01-17 DIAGNOSIS — R05 Cough: Secondary | ICD-10-CM | POA: Diagnosis not present

## 2017-01-17 DIAGNOSIS — J918 Pleural effusion in other conditions classified elsewhere: Secondary | ICD-10-CM | POA: Diagnosis not present

## 2017-01-17 DIAGNOSIS — R0981 Nasal congestion: Secondary | ICD-10-CM | POA: Diagnosis not present

## 2017-01-17 DIAGNOSIS — I517 Cardiomegaly: Secondary | ICD-10-CM | POA: Diagnosis not present

## 2017-02-16 DEATH — deceased

## 2017-02-17 IMAGING — MR MR MRA HEAD W/O CM
1 series · 13 of 48 positions shown · non-contrast
Comparison: Head CT from yesterday.

CLINICAL DATA: Weakness.  Concern for stroke.

EXAM:
MRI HEAD WITHOUT CONTRAST
MRA HEAD WITHOUT CONTRAST
TECHNIQUE: Multiplanar, multiecho pulse sequences of the brain and surrounding
structures were obtained without intravenous contrast. Angiographic
images of the head were obtained using MRA technique without
contrast.

[Series 3: MRA · axial · 0.7mm · 0.38mm/px · z∈[-62,+43]mm · 13 of 162 slices shown]
[im 1/162]
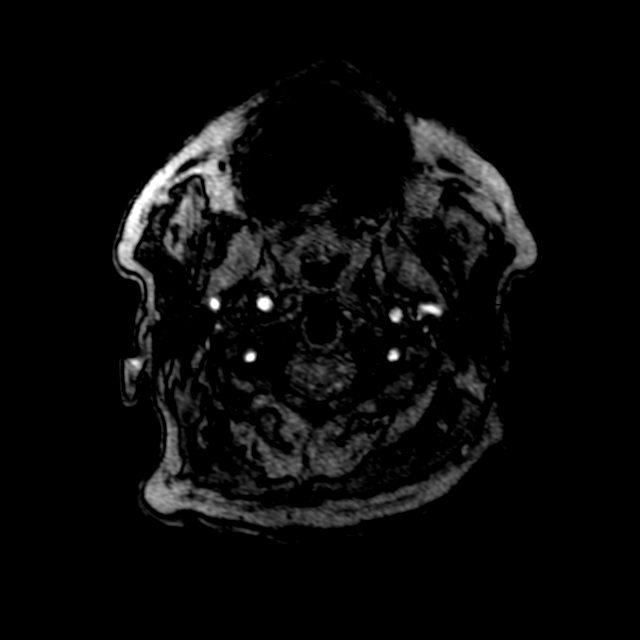
[im 4/162]
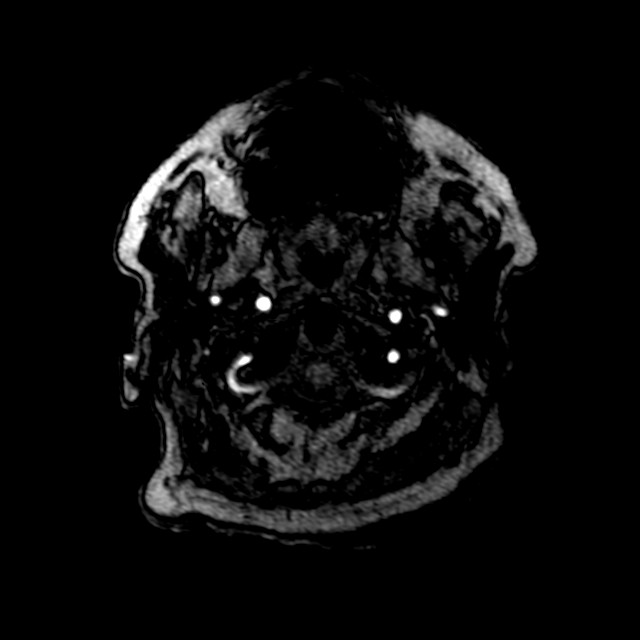
[im 11/162]
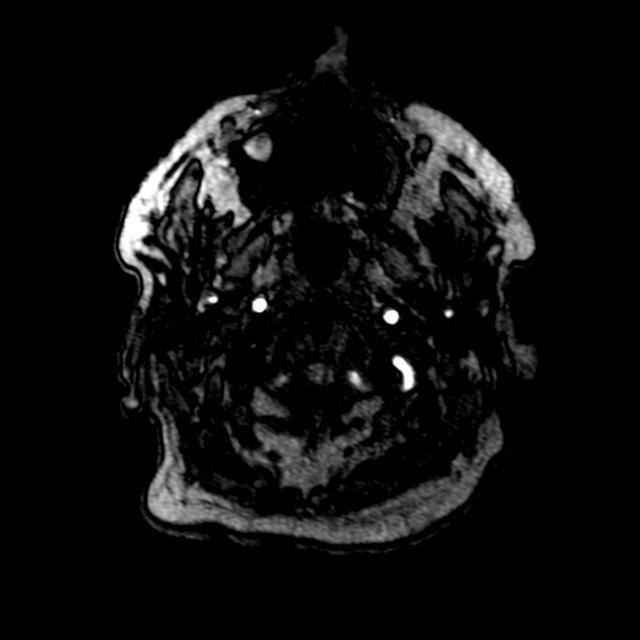
[im 28/162]
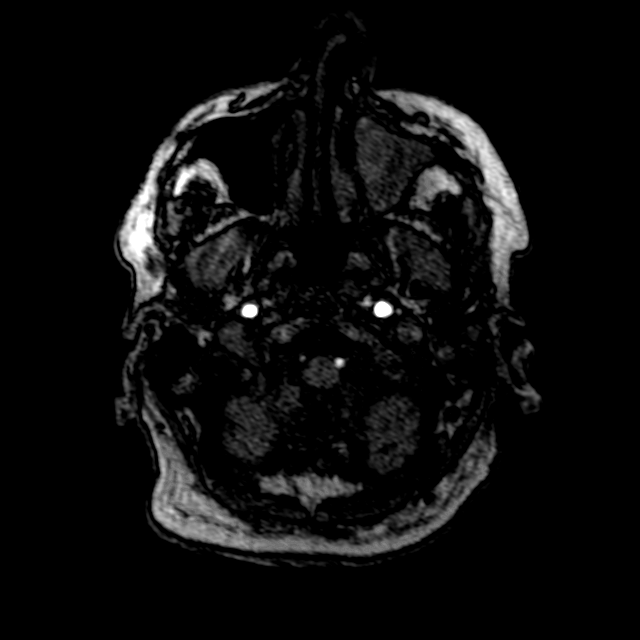
[im 31/162]
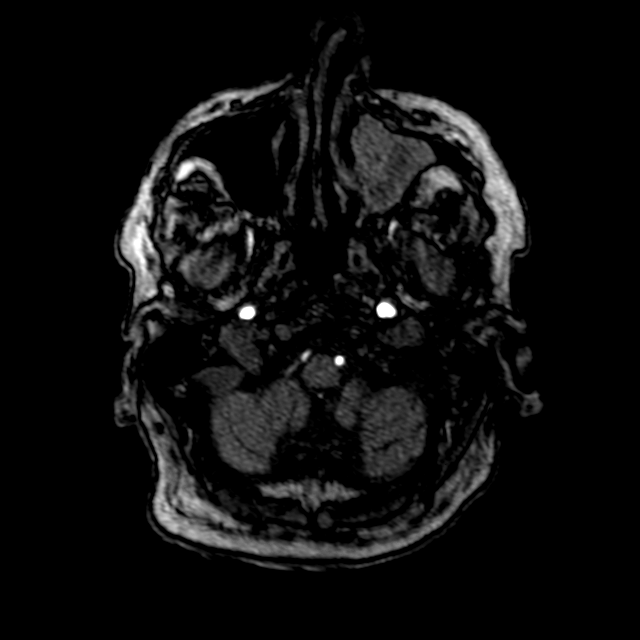
[im 52/162]
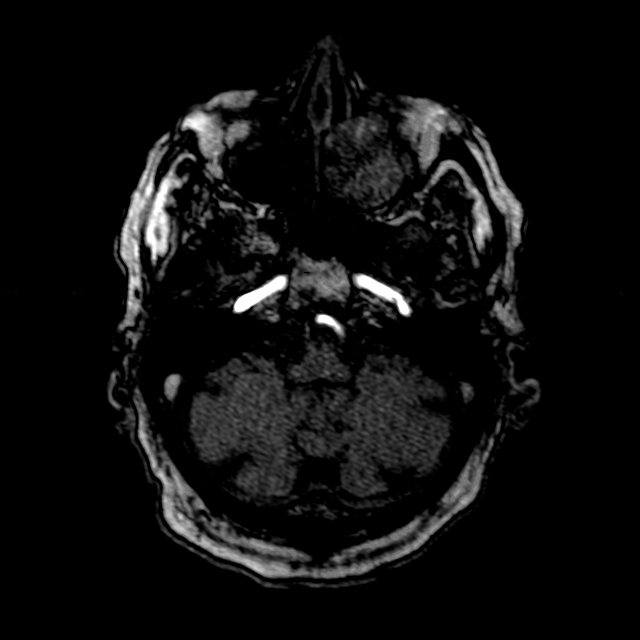
[im 72/162]
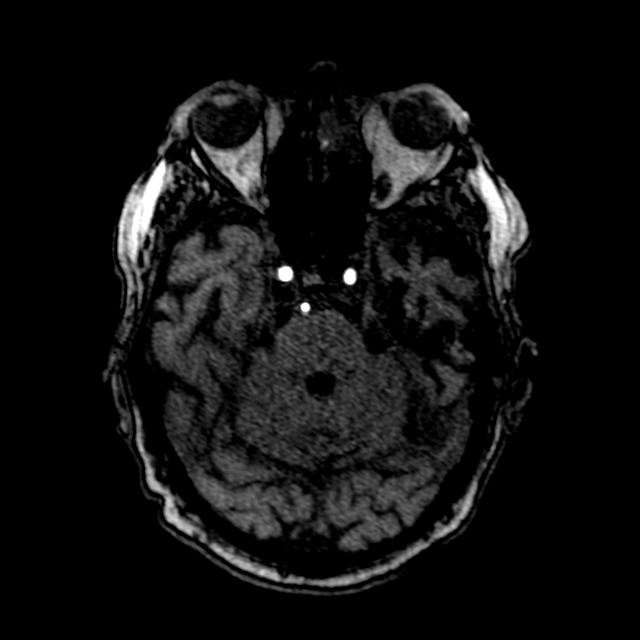
[im 83/162]
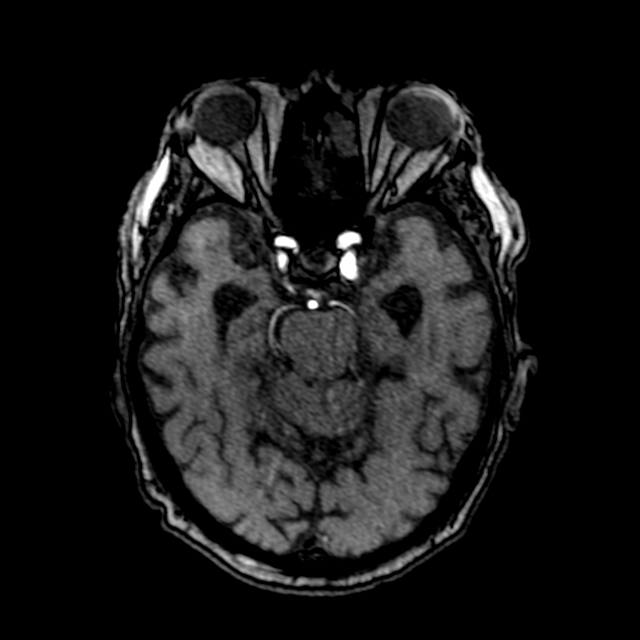
[im 93/162]
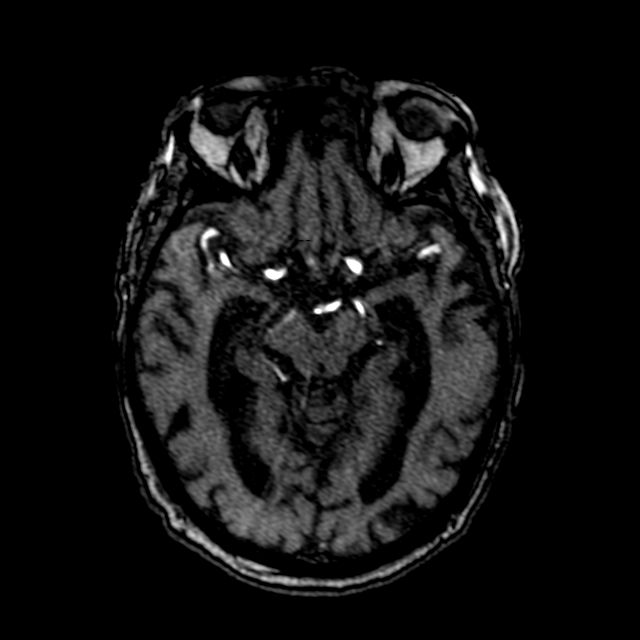
[im 114/162]
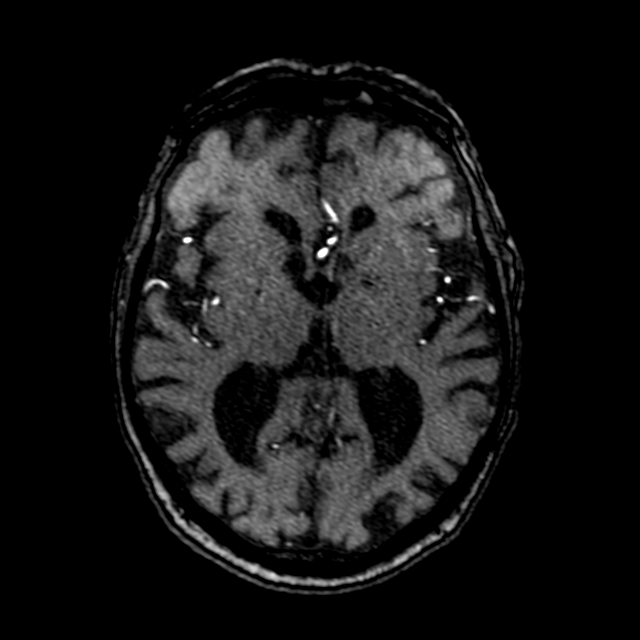
[im 134/162]
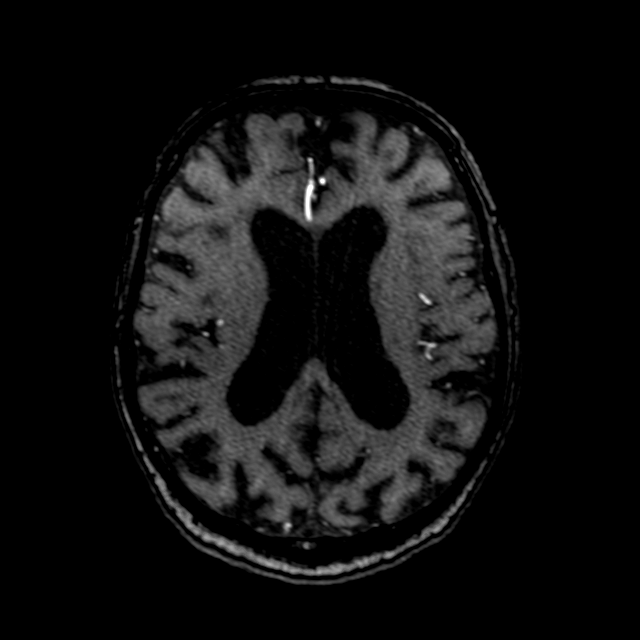
[im 138/162]
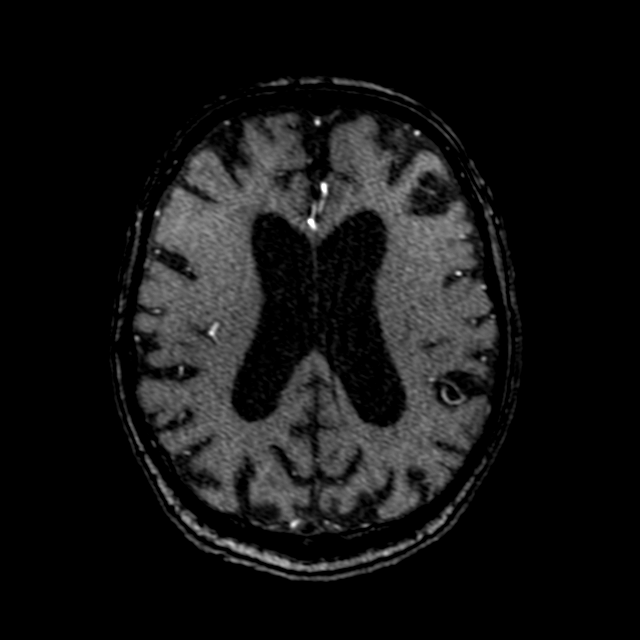
[im 155/162]
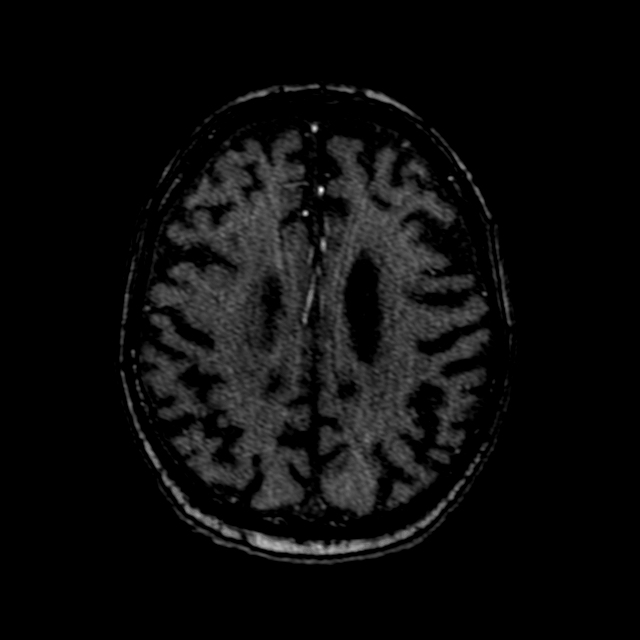

[13 of 48 positions shown; findings below may reference images not displayed]

FINDINGS: MRI HEAD FINDINGS

Brain: No acute infarct, hemorrhage, hydrocephalus, or mass. There
is advanced atrophy. Prominent FLAIR hyperintensity around the
temporal horns the lateral ventricles is likely gliosis. No swelling
to suggest that this is edema or inflammation. Rather, the temporal
horns of the lateral ventricles are markedly dilated from advanced
mesial temporal volume loss, finding which can be seen with
Alzheimer's disease in the appropriate clinical setting.
Small-vessel ischemic gliosis in the cerebral white matter is
overall mild for age. Small remote infarct in the left cerebellum.
No chronic blood products.

Vascular: Preserved flow voids.  Arterial findings below

Skull and upper cervical spine: Facet arthropathy in the visible
cervical spine. No marrow lesion noted.

Sinuses/Orbits: There is near complete opacification of left
maxillary, anterior ethmoid, and frontal sinuses due to middle
meatus obstruction. Left maxillary sinus opacification is from
mucosal thickening and hypointense inspissated secretions or fungal
elements which medially bow the medial wall. No evidence of orbital
intracranial extension. No acute orbital finding. Left cataract
resection.

Other: Subcentimeter probable cyst in the left parotid region,
considered incidental

MRA HEAD FINDINGS

Dominant left vertebral artery. Fetal type PCA on the right.
Anterior communicating artery. No visible left posterior
communicating artery. No major branch occlusion or flow limiting
stenosis. Apparent narrowing is at the bilateral M2 branches is
likely from artifact. Negative for aneurysm.
IMPRESSION: 1. No acute finding.
2. Atrophy, including prominent mesial temporal volume loss. If
there is dementia, Alzheimer's disease could have this pattern.
3. Mild for age chronic microvascular ischemia.
4. Chronic sinusitis from left middle meatus obstruction, as
described.
